# Patient Record
Sex: Male | Born: 2000 | Hispanic: Yes | Marital: Single | Smoking: Never smoker
Health system: Southern US, Community
[De-identification: ages and names within clinical notes are randomized; demographics above are authoritative.]

## PROBLEM LIST (undated history)

## (undated) ENCOUNTER — Ambulatory Visit (HOSPITAL_COMMUNITY): Discharge status: Absent without leave | Payer: No Payment, Other

## (undated) DIAGNOSIS — S52509A Unspecified fracture of the lower end of unspecified radius, initial encounter for closed fracture: Secondary | ICD-10-CM

## (undated) DIAGNOSIS — S025XXA Fracture of tooth (traumatic), initial encounter for closed fracture: Secondary | ICD-10-CM

## (undated) DIAGNOSIS — U071 COVID-19: Secondary | ICD-10-CM

## (undated) DIAGNOSIS — N4400 Torsion of testis, unspecified: Secondary | ICD-10-CM

## (undated) DIAGNOSIS — F909 Attention-deficit hyperactivity disorder, unspecified type: Secondary | ICD-10-CM

## (undated) DIAGNOSIS — S060XAA Concussion with loss of consciousness status unknown, initial encounter: Secondary | ICD-10-CM

## (undated) HISTORY — PX: SURGERY SCROTAL / TESTICULAR: SUR1316

## (undated) HISTORY — DX: Unspecified fracture of the lower end of unspecified radius, initial encounter for closed fracture: S52.509A

## (undated) HISTORY — DX: Attention-deficit hyperactivity disorder, unspecified type: F90.9

## (undated) HISTORY — DX: Fracture of tooth (traumatic), initial encounter for closed fracture: S02.5XXA

---
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
## 2019-07-24 ENCOUNTER — Emergency Department (HOSPITAL_COMMUNITY)
Admission: EM | Admit: 2019-07-24 | Discharge: 2019-07-24 | Disposition: A | Discharge status: Home / Self Care | Payer: BC Managed Care – PPO | Attending: Emergency Medicine | Admitting: Emergency Medicine

## 2019-07-24 ENCOUNTER — Encounter (HOSPITAL_COMMUNITY): Payer: Self-pay | Admitting: Emergency Medicine

## 2019-07-24 ENCOUNTER — Other Ambulatory Visit: Payer: Self-pay

## 2019-07-24 ENCOUNTER — Emergency Department (HOSPITAL_COMMUNITY): Payer: BC Managed Care – PPO

## 2019-07-24 DIAGNOSIS — Y9389 Activity, other specified: Secondary | ICD-10-CM | POA: Insufficient documentation

## 2019-07-24 DIAGNOSIS — M545 Low back pain, unspecified: Secondary | ICD-10-CM

## 2019-07-24 DIAGNOSIS — Y998 Other external cause status: Secondary | ICD-10-CM | POA: Insufficient documentation

## 2019-07-24 DIAGNOSIS — Y92410 Unspecified street and highway as the place of occurrence of the external cause: Secondary | ICD-10-CM | POA: Diagnosis not present

## 2019-07-24 MED ORDER — IBUPROFEN 600 MG PO TABS: 600.0000 mg | ORAL_TABLET | Freq: Four times a day (QID) | ORAL | 0 refills | Status: DC | PRN

## 2019-07-24 MED ORDER — METHOCARBAMOL 500 MG PO TABS
500.0000 mg | ORAL_TABLET | Freq: Once | ORAL | Status: AC
  Administered 2019-07-24: 21:00:00 500 mg via ORAL
  Filled 2019-07-24: qty 1
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
## 2019-07-24 NOTE — Discharge Instructions (Addendum)
Take Motrin as needed for pain.  Ice affected area.  Follow-up with your primary care provider in 2 days for continued evaluation.  Return to the emergency room immediately for new or worsening symptoms or concerns, such as new or worsening pain, difficulty peeing, numbness of the groin, difficulty walking or any concerns at all.

## 2019-07-28 ENCOUNTER — Other Ambulatory Visit: Payer: Self-pay

## 2019-07-28 ENCOUNTER — Encounter (HOSPITAL_COMMUNITY): Payer: Self-pay | Admitting: Emergency Medicine

## 2019-07-28 ENCOUNTER — Emergency Department (HOSPITAL_COMMUNITY)
Admission: EM | Admit: 2019-07-28 | Discharge: 2019-07-28 | Disposition: A | Discharge status: Home / Self Care | Payer: BC Managed Care – PPO | Attending: Emergency Medicine | Admitting: Emergency Medicine

## 2019-07-28 DIAGNOSIS — S060X0A Concussion without loss of consciousness, initial encounter: Secondary | ICD-10-CM | POA: Insufficient documentation

## 2019-07-28 DIAGNOSIS — F909 Attention-deficit hyperactivity disorder, unspecified type: Secondary | ICD-10-CM | POA: Diagnosis not present

## 2019-07-28 DIAGNOSIS — S0990XA Unspecified injury of head, initial encounter: Secondary | ICD-10-CM | POA: Diagnosis present

## 2019-07-28 DIAGNOSIS — Y93I9 Activity, other involving external motion: Secondary | ICD-10-CM | POA: Diagnosis not present

## 2019-07-28 DIAGNOSIS — Z79899 Other long term (current) drug therapy: Secondary | ICD-10-CM | POA: Insufficient documentation

## 2019-07-28 DIAGNOSIS — Y999 Unspecified external cause status: Secondary | ICD-10-CM | POA: Diagnosis not present

## 2019-07-28 DIAGNOSIS — Y92410 Unspecified street and highway as the place of occurrence of the external cause: Secondary | ICD-10-CM | POA: Insufficient documentation

## 2019-07-28 DIAGNOSIS — S060X0D Concussion without loss of consciousness, subsequent encounter: Secondary | ICD-10-CM

## 2019-07-28 MED ORDER — METHOCARBAMOL 500 MG PO TABS: 500.0000 mg | ORAL_TABLET | Freq: Two times a day (BID) | ORAL | 0 refills | Status: DC

## 2019-07-28 MED ORDER — LIDOCAINE 5 % EX PTCH: 1.0000 | MEDICATED_PATCH | CUTANEOUS | 0 refills | Status: DC

## 2019-07-28 MED ORDER — ONDANSETRON 4 MG PO TBDP: 4.0000 mg | ORAL_TABLET | Freq: Three times a day (TID) | ORAL | 0 refills | Status: DC | PRN

## 2019-07-28 NOTE — ED Notes (Signed)
Patient given discharge instructions patient verbalizes understanding. 

## 2019-07-28 NOTE — Discharge Instructions (Signed)
Head Injury You have been seen today for a head injury. It does not appear to be serious at this time.  Close observation: The close observation period is usually 6 hours from the injury. This includes staying awake and having a trustworthy adult monitor you to assure your condition does not worsen. You should be in regular contact with this person and ideally, they should be able to monitor you in person.  Secondary observation: The secondary observation period is usually 24 hours from the injury. You are allowed to sleep during this time. A trustworthy adult should intermittently monitor you to assure your condition does not worsen.   Overall head injury/concussion care: Rest: Be sure to get plenty of rest. You will need more rest and sleep while you recover. Hydration: Be sure to stay well hydrated by having a goal of drinking about 0.5 liters of water an hour. Pain:  Antiinflammatory medications: Take 600 mg of ibuprofen every 6 hours or 440 mg (over the counter dose) to 500 mg (prescription dose) of naproxen every 12 hours or for the next 3 days. After this time, these medications may be used as needed for pain. Take these medications with food to avoid upset stomach. Choose only one of these medications, do not take them together. Tylenol: Should you continue to have additional pain while taking the ibuprofen or naproxen, you may add in tylenol as needed. Your daily total maximum amount of tylenol from all sources should be limited to 4000mg /day for persons without liver problems, or 2000mg /day for those with liver problems. Nausea/vomiting: Use the ondansetron (generic for Zofran) for nausea or vomiting.  This medication may not prevent all vomiting or nausea, but can help facilitate better hydration. Things that can help with nausea/vomiting also include peppermint/menthol candies, vitamin B12, and ginger. Return to sports and activities: In general, you may return to normal activities once  symptoms have subsided, however, you would ideally be cleared by a primary care provider or other qualified medical professional prior to return to these activities.  Follow up: Follow up with the concussion clinic or your primary care provider for further management of this issue. Return: Return to the ED should you begin to have confusion, abnormal behavior, aggression, violence, or personality changes, repeated vomiting, vision loss, numbness or weakness on one side of the body, difficulty standing due to dizziness, significantly worsening pain, or any other major concerns.    Take it easy, but do not lay around too much as this may make any stiffness worse.  Antiinflammatory medications: Take 600 mg of ibuprofen every 6 hours or 440 mg (over the counter dose) to 500 mg (prescription dose) of naproxen every 12 hours for the next 3 days. After this time, these medications may be used as needed for pain. Take these medications with food to avoid upset stomach. Choose only one of these medications, do not take them together. Acetaminophen (generic for Tylenol): Should you continue to have additional pain while taking the ibuprofen or naproxen, you may add in acetaminophen as needed. Your daily total maximum amount of acetaminophen from all sources should be limited to 4000mg /day for persons without liver problems, or 2000mg /day for those with liver problems. Methocarbamol: Methocarbamol (generic for Robaxin) is a muscle relaxer and can help relieve stiff muscles or muscle spasms.  Do not drive or perform other dangerous activities while taking this medication as it can cause drowsiness as well as changes in reaction time and judgement. Lidocaine patches: These are available via  either prescription or over-the-counter. The over-the-counter option may be more economical one and are likely just as effective. There are multiple over-the-counter brands, such as Salonpas. Ice: May apply ice to the area over  the next 24 hours for 15 minutes at a time to reduce pain, inflammation, and swelling, if present. Exercises: Be sure to perform the attached exercises starting with three times a week and working up to performing them daily. This is an essential part of preventing long term problems.  Follow up: Follow up with a primary care provider for any future management of these complaints. Be sure to follow up within 7-10 days. Return: Return to the ED should symptoms worsen.  For prescription assistance, may try using prescription discount sites or apps, such as goodrx.com
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
## 2019-09-16 ENCOUNTER — Ambulatory Visit (HOSPITAL_COMMUNITY)
Admission: EM | Admit: 2019-09-16 | Discharge: 2019-09-16 | Disposition: A | Discharge status: Home / Self Care | Payer: BC Managed Care – PPO | Attending: Internal Medicine | Admitting: Internal Medicine

## 2019-09-16 ENCOUNTER — Other Ambulatory Visit: Payer: Self-pay

## 2019-09-16 ENCOUNTER — Encounter (HOSPITAL_COMMUNITY): Payer: Self-pay

## 2019-09-16 DIAGNOSIS — B349 Viral infection, unspecified: Secondary | ICD-10-CM | POA: Diagnosis not present

## 2019-09-16 DIAGNOSIS — R070 Pain in throat: Secondary | ICD-10-CM | POA: Diagnosis not present

## 2019-09-16 DIAGNOSIS — Z20822 Contact with and (suspected) exposure to covid-19: Secondary | ICD-10-CM | POA: Diagnosis not present

## 2019-09-16 DIAGNOSIS — J029 Acute pharyngitis, unspecified: Secondary | ICD-10-CM | POA: Insufficient documentation

## 2019-09-16 DIAGNOSIS — R197 Diarrhea, unspecified: Secondary | ICD-10-CM | POA: Insufficient documentation

## 2019-09-16 DIAGNOSIS — Z79899 Other long term (current) drug therapy: Secondary | ICD-10-CM | POA: Diagnosis not present

## 2019-09-16 LAB — POCT RAPID STREP A: Streptococcus, Group A Screen (Direct): NEGATIVE

## 2019-09-16 NOTE — Discharge Instructions (Addendum)
This is most likely viral. Your rapid strep test was negative. You can continue over-the-counter medication as needed. We will call you with any positive Covid results

## 2019-09-16 NOTE — ED Triage Notes (Signed)
Patient reports diarrhea, cough, and sore throat x1 wk

## 2019-09-16 NOTE — ED Provider Notes (Signed)
[facility]    CSN: [id number] Arrival date & time: 09/16/19  1211      History   Chief Complaint Chief Complaint  Patient presents with  . Sore Throat  . Cough  . Diarrhea    HPI Steven Rhodes is a 19 y.o. male.   Patient is an otherwise healthy 19 year old male who presents today for approximate 1 week of cough, sore throat, nasal congestion and rhinorrhea. Symptoms been constant. He has been taking Zyrtec-D with some relief. Denies any associated fever, chills, body aches, loss of taste or smell. He reports a few episodes of diarrhea associated. Works at a high school as a Copy.  ROS per HPI      Past Medical History:  Diagnosis Date  . ADHD (attention deficit hyperactivity disorder)   . Fracture, radius, distal 03/16/11  . FRACTURED TOOTH 08/29/2009   Qualifier: Diagnosis of  By: Sheffield Slider MD, Deniece Portela    . Premature birth     Patient Active Problem List   Diagnosis Date Noted  . Gastroenteritis 11/27/2017  . Self-injurious behavior 05/10/2017  . 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 Depression   . Depression with anxiety 03/04/2017  . H/O sexually transmitted disease 08/23/2016  . Abdominal pain 06/25/2014  . Oppositional defiant behavior 06/18/2011  . Tinea corporis 10/02/2010  . PREMATURE BIRTH 08/29/2009  . VISUAL ACUITY, DECREASED 09/30/2007  . Attention deficit hyperactivity disorder (ADHD) 07/25/2006    Past Surgical History:  Procedure Laterality Date  . ORCHIOPEXY  05/09/2012   Procedure: ORCHIOPEXY PEDIATRIC;  Surgeon: Valetta Fuller, MD;  Location: [facility] OR;  Service: Urology;  Laterality: Bilateral;  . TESTICLE TORSION REDUCTION  05/09/2012   Procedure: TESTICULAR TORSION REPAIR;  Surgeon: Valetta Fuller, MD;  Location: [facility] OR;  Service: Urology;  Laterality: N/A;       Home Medications    Prior to Admission medications   Medication Sig Start Date End Date Taking? Authorizing Provider   benzonatate (TESSALON) 100 MG capsule Take 1 capsule (100 mg total) by mouth 2 (two) times daily as needed for cough. 10/06/17   Wurst, [place], PA-C  cetirizine-pseudoephedrine (ZYRTEC-D) 5-120 MG tablet Take 1 tablet by mouth daily. 10/06/17   Wurst, [place], PA-C  ibuprofen (ADVIL) 600 MG tablet Take 1 tablet (600 mg total) by mouth every 6 (six) hours as needed. 07/24/19   Kendrick, Caitlyn S, PA-C  lidocaine (LIDODERM) 5 % Place 1 patch onto the skin daily. Remove & Discard patch within 12 hours or as directed by MD 07/28/19   Joy, Shawn C, PA-C  lidocaine (XYLOCAINE) 2 % solution Use as directed 15 mLs in the mouth or throat as needed for mouth pain. 10/06/17   Wurst, [place], PA-C  methocarbamol (ROBAXIN) 500 MG tablet Take 1 tablet (500 mg total) by mouth 2 (two) times daily. 07/28/19   Joy, Shawn C, PA-C  ondansetron (ZOFRAN ODT) 4 MG disintegrating tablet Take 1 tablet (4 mg total) by mouth every 8 (eight) hours as needed for nausea or vomiting. 07/28/19   Joy, Hillard Danker, PA-C    Family History Family History  Problem Relation Age of Onset  . Healthy Mother   . Diabetes Father   . Hypertension Father     Social History Social History   Tobacco Use  . Smoking status: Never Smoker  . Smokeless tobacco: Never Used  Substance Use Topics  . Alcohol use: Never  . Drug use: Never  Allergies   Patient has no known allergies.   Review of Systems Review of Systems   Physical Exam Triage Vital Signs ED Triage Vitals  Enc Vitals Group     BP 09/16/19 1222 134/65     Pulse Rate 09/16/19 1222 63     Resp 09/16/19 1222 18     Temp 09/16/19 1222 98.3 F (36.8 C)     Temp Source 09/16/19 1222 Oral     SpO2 09/16/19 1222 99 %     Weight --      Height --      Head Circumference --      Peak Flow --      Pain Score 09/16/19 1221 6     Pain Loc --      Pain Edu? --      Excl. in [place]? --    No data found.  Updated Vital Signs BP 134/65 (BP Location: Right Arm)   Pulse 63    Temp 98.3 F (36.8 C) (Oral)   Resp 18   SpO2 99%   Visual Acuity Right Eye Distance:   Left Eye Distance:   Bilateral Distance:    Right Eye Near:   Left Eye Near:    Bilateral Near:     Physical Exam Vitals and nursing note reviewed.  Constitutional:      General: He is not in acute distress.    Appearance: Normal appearance. He is not ill-appearing, toxic-appearing or diaphoretic.  HENT:     Head: Normocephalic and atraumatic.     Right Ear: Tympanic membrane and ear canal normal.     Left Ear: Tympanic membrane and ear canal normal.     Nose: Nose normal.     Mouth/Throat:     Pharynx: Posterior oropharyngeal erythema present.     Comments: 2+ tonsillar swelling bilateral. No exudate Eyes:     Conjunctiva/sclera: Conjunctivae normal.  Cardiovascular:     Rate and Rhythm: Normal rate and regular rhythm.  Pulmonary:     Effort: Pulmonary effort is normal.     Breath sounds: Normal breath sounds.  Musculoskeletal:        General: Normal range of motion.     Cervical back: Normal range of motion.  Skin:    General: Skin is warm and dry.  Neurological:     Mental Status: He is alert.  Psychiatric:        Mood and Affect: Mood normal.      UC Treatments / Results  Labs (all labs ordered are listed, but only abnormal results are displayed) Labs Reviewed  SARS CORONAVIRUS 2 (TAT 6-24 HRS)  CULTURE, GROUP A STREP [facility])  POCT RAPID STREP A    EKG   Radiology No results found.  Procedures Procedures (including critical care time)  Medications Ordered in UC Medications - No data to display  Initial Impression / Assessment and Plan / UC Course  I have reviewed the triage vital signs and the nursing notes.  Pertinent labs & imaging results that were available during my care of the patient were reviewed by me and considered in my medical decision making (see chart for details).     Viral illness Rapid strep test negative Over-the-counter  medications as needed. Continue the allergy medication Covid swab pending we will call with any positive results Final Clinical Impressions(s) / UC Diagnoses   Final diagnoses:  Viral illness     Discharge Instructions     This is most likely  viral. Your rapid strep test was negative. You can continue over-the-counter medication as needed. We will call you with any positive Covid results    ED Prescriptions    None     PDMP not reviewed this encounter.   Janace Aris, NP 09/16/19 1307

## 2019-09-17 LAB — SARS CORONAVIRUS 2 (TAT 6-24 HRS): SARS Coronavirus 2: NEGATIVE

## 2019-09-18 LAB — CULTURE, GROUP A STREP (THRC)

## 2019-10-17 ENCOUNTER — Other Ambulatory Visit: Payer: Self-pay

## 2019-10-17 ENCOUNTER — Ambulatory Visit (HOSPITAL_COMMUNITY)
Admission: EM | Admit: 2019-10-17 | Discharge: 2019-10-17 | Disposition: A | Discharge status: Home / Self Care | Payer: BC Managed Care – PPO | Attending: Urgent Care | Admitting: Urgent Care

## 2019-10-17 ENCOUNTER — Encounter (HOSPITAL_COMMUNITY): Payer: Self-pay

## 2019-10-17 DIAGNOSIS — R0981 Nasal congestion: Secondary | ICD-10-CM | POA: Insufficient documentation

## 2019-10-17 DIAGNOSIS — R0789 Other chest pain: Secondary | ICD-10-CM

## 2019-10-17 DIAGNOSIS — R05 Cough: Secondary | ICD-10-CM | POA: Insufficient documentation

## 2019-10-17 DIAGNOSIS — R519 Headache, unspecified: Secondary | ICD-10-CM | POA: Insufficient documentation

## 2019-10-17 DIAGNOSIS — J029 Acute pharyngitis, unspecified: Secondary | ICD-10-CM | POA: Insufficient documentation

## 2019-10-17 DIAGNOSIS — R059 Cough, unspecified: Secondary | ICD-10-CM

## 2019-10-17 DIAGNOSIS — R52 Pain, unspecified: Secondary | ICD-10-CM

## 2019-10-17 DIAGNOSIS — Z20822 Contact with and (suspected) exposure to covid-19: Secondary | ICD-10-CM | POA: Insufficient documentation

## 2019-10-17 DIAGNOSIS — J069 Acute upper respiratory infection, unspecified: Secondary | ICD-10-CM | POA: Insufficient documentation

## 2019-10-17 LAB — SARS CORONAVIRUS 2 (TAT 6-24 HRS): SARS Coronavirus 2: NEGATIVE

## 2019-10-17 LAB — POCT RAPID STREP A: Streptococcus, Group A Screen (Direct): NEGATIVE

## 2019-10-17 MED ORDER — ACETAMINOPHEN 325 MG PO TABS
ORAL_TABLET | ORAL | Status: AC
  Filled 2019-10-17: qty 2

## 2019-10-17 MED ORDER — ACETAMINOPHEN 325 MG PO TABS
650.0000 mg | ORAL_TABLET | Freq: Once | ORAL | Status: AC
  Administered 2019-10-17: 650 mg via ORAL

## 2019-10-17 MED ORDER — PSEUDOEPHEDRINE HCL 60 MG PO TABS: 60.0000 mg | ORAL_TABLET | Freq: Three times a day (TID) | ORAL | 0 refills | Status: DC | PRN

## 2019-10-17 MED ORDER — CETIRIZINE HCL 10 MG PO TABS: 10.0000 mg | ORAL_TABLET | Freq: Every day | ORAL | 0 refills | Status: DC

## 2019-10-17 MED ORDER — BENZONATATE 100 MG PO CAPS: 100.0000 mg | ORAL_CAPSULE | Freq: Three times a day (TID) | ORAL | 0 refills | Status: DC | PRN

## 2019-10-17 MED ORDER — PROMETHAZINE-DM 6.25-15 MG/5ML PO SYRP: 5.0000 mL | ORAL_SOLUTION | Freq: Every evening | ORAL | 0 refills | Status: DC | PRN

## 2019-10-17 NOTE — Discharge Instructions (Addendum)

## 2019-10-17 NOTE — ED Triage Notes (Signed)
Pt reports nasal congestion, sore throat, body aches and headaches x 3 days. NyQuil did not help with the symptoms.

## 2019-10-17 NOTE — ED Provider Notes (Signed)
Steven Rhodes   MRN: [id number] DOB: July 10, 2000  Subjective:   Steven Rhodes is a 19 y.o. male presenting for 3 day hx of acute onset malaise. ROS as below. Taking APAP, Vick's. Denies hx of asthma. Does vaping. Had COVID 19 screening as a part of his job 1 week ago but never got his results. Has not had COVID 19 vaccine. Has not had COVID 19 yet.   Denies taking chronic medications.    No Known Allergies  Past Medical History:  Diagnosis Date  . ADHD (attention deficit hyperactivity disorder)   . Fracture, radius, distal 03/16/11  . FRACTURED TOOTH 08/29/2009   Qualifier: Diagnosis of  By: Walker Kehr MD, Patrick Jupiter    . Premature birth      Past Surgical History:  Procedure Laterality Date  . ORCHIOPEXY  05/09/2012   Procedure: ORCHIOPEXY PEDIATRIC;  Surgeon: Bernestine Amass, MD;  Location: [place];  Service: Urology;  Laterality: Bilateral;  . TESTICLE TORSION REDUCTION  05/09/2012   Procedure: TESTICULAR TORSION REPAIR;  Surgeon: Bernestine Amass, MD;  Location: [place];  Service: Urology;  Laterality: N/A;    Family History  Problem Relation Age of Onset  . Healthy Mother   . Diabetes Father   . Hypertension Father     Social History   Tobacco Use  . Smoking status: Never Smoker  . Smokeless tobacco: Never Used  Substance Use Topics  . Alcohol use: Never  . Drug use: Never    Review of Systems  Constitutional: Positive for fever and malaise/fatigue.  HENT: Positive for congestion and sore throat. Negative for ear pain and sinus pain.   Eyes: Negative for discharge and redness.  Respiratory: Positive for cough. Negative for hemoptysis, shortness of breath and wheezing.   Cardiovascular: Positive for chest pain.  Gastrointestinal: Negative for abdominal pain, diarrhea, nausea and vomiting.  Genitourinary: Negative for dysuria, flank pain and hematuria.  Musculoskeletal: Positive for myalgias.  Skin: Negative for rash.  Neurological: Negative for dizziness,  weakness and headaches.  Psychiatric/Behavioral: Negative for depression and substance abuse.     Objective:   Vitals: BP 110/88 (BP Location: Right Arm)   Pulse (!) 104   Temp (!) 100.7 F (38.2 C) (Oral)   Resp (!) 24   SpO2 100%   Pulse was 96 on recheck. Temp 99.63F.   Physical Exam Constitutional:      General: He is not in acute distress.    Appearance: Normal appearance. He is well-developed and normal weight. He is not ill-appearing, toxic-appearing or diaphoretic.  HENT:     Head: Normocephalic and atraumatic.     Right Ear: Tympanic membrane, ear canal and external ear normal. There is no impacted cerumen.     Left Ear: Tympanic membrane, ear canal and external ear normal. There is no impacted cerumen.     Nose: Nose normal. No congestion or rhinorrhea.     Mouth/Throat:     Mouth: Mucous membranes are moist.     Pharynx: Oropharynx is clear. No oropharyngeal exudate or posterior oropharyngeal erythema.  Eyes:     General: No scleral icterus.       Right eye: No discharge.        Left eye: No discharge.     Extraocular Movements: Extraocular movements intact.     Conjunctiva/sclera: Conjunctivae normal.     Pupils: Pupils are equal, round, and reactive to light.  Cardiovascular:     Rate and Rhythm: Normal rate and regular rhythm.  Heart sounds: Normal heart sounds. No murmur. No friction rub. No gallop.   Pulmonary:     Effort: Pulmonary effort is normal. No respiratory distress.     Breath sounds: Normal breath sounds. No stridor. No wheezing, rhonchi or rales.  Musculoskeletal:     Cervical back: Normal range of motion and neck supple. No rigidity. No muscular tenderness.  Skin:    General: Skin is warm and dry.  Neurological:     General: No focal deficit present.     Mental Status: He is alert and oriented to person, place, and time.  Psychiatric:        Mood and Affect: Mood normal.        Behavior: Behavior normal.        Thought Content: Thought  content normal.     Results for orders placed or performed during the hospital encounter of 10/17/19 (from the past 24 hour(s))  POCT rapid strep A [facility])     Status: None   Collection Time: 10/17/19  3:33 PM  Result Value Ref Range   Streptococcus, Group A Screen (Direct) NEGATIVE NEGATIVE    Assessment and Plan :   PDMP not reviewed this encounter.  1. Nasal congestion   2. Sore throat   3. Cough   4. Body aches   5. Generalized headaches   6. Atypical chest pain     Will manage for viral illness such as viral URI, viral syndrome, viral rhinitis, COVID-19. Counseled patient on nature of COVID-19 including modes of transmission, diagnostic testing, management and supportive care.  Offered symptomatic relief. COVID 19 testing is pending. Counseled patient on potential for adverse effects with medications prescribed/recommended today, ER and return-to-clinic precautions discussed, patient verbalized understanding.     Steven Rhodes, [place] 10/17/19 1603

## 2019-10-20 LAB — CULTURE, GROUP A STREP (THRC)

## 2020-02-12 ENCOUNTER — Emergency Department (HOSPITAL_COMMUNITY)
Admission: EM | Admit: 2020-02-12 | Discharge: 2020-02-13 | Disposition: A | Discharge status: Home / Self Care | Payer: HRSA Program | Attending: Emergency Medicine | Admitting: Emergency Medicine

## 2020-02-12 ENCOUNTER — Emergency Department (HOSPITAL_COMMUNITY): Payer: HRSA Program

## 2020-02-12 ENCOUNTER — Other Ambulatory Visit: Payer: Self-pay

## 2020-02-12 DIAGNOSIS — U071 COVID-19: Secondary | ICD-10-CM

## 2020-02-12 DIAGNOSIS — Z79899 Other long term (current) drug therapy: Secondary | ICD-10-CM | POA: Insufficient documentation

## 2020-02-12 DIAGNOSIS — R109 Unspecified abdominal pain: Secondary | ICD-10-CM | POA: Diagnosis not present

## 2020-02-12 DIAGNOSIS — R509 Fever, unspecified: Secondary | ICD-10-CM | POA: Diagnosis present

## 2020-02-12 LAB — COMPREHENSIVE METABOLIC PANEL
ALT: 43 U/L (ref 0–44)
AST: 32 U/L (ref 15–41)
Albumin: 4.5 g/dL (ref 3.5–5.0)
Alkaline Phosphatase: 65 U/L (ref 38–126)
Anion gap: 13 (ref 5–15)
BUN: 16 mg/dL (ref 6–20)
CO2: 26 mmol/L (ref 22–32)
Calcium: 9.9 mg/dL (ref 8.9–10.3)
Chloride: 99 mmol/L (ref 98–111)
Creatinine, Ser: 1.16 mg/dL (ref 0.61–1.24)
GFR calc Af Amer: >60 mL/min (ref >60)
GFR calc non Af Amer: >60 mL/min (ref >60)
Glucose, Bld: 100 mg/dL — ABNORMAL HIGH (ref 70–99)
Potassium: 4.3 mmol/L (ref 3.5–5.1)
Sodium: 138 mmol/L (ref 135–145)
Total Bilirubin: 0.9 mg/dL (ref 0.3–1.2)
Total Protein: 8.4 g/dL — ABNORMAL HIGH (ref 6.5–8.1)

## 2020-02-12 LAB — CBC WITH DIFFERENTIAL/PLATELET
Abs Immature Granulocytes: 0.05 10*3/uL (ref 0.00–0.07)
Basophils Absolute: 0 10*3/uL (ref 0.0–0.1)
Basophils Relative: 0 %
Eosinophils Absolute: 0 10*3/uL (ref 0.0–0.5)
Eosinophils Relative: 0 %
HCT: 47.1 % (ref 39.0–52.0)
Hemoglobin: 15.1 g/dL (ref 13.0–17.0)
Immature Granulocytes: 1 %
Lymphocytes Relative: 11 %
Lymphs Abs: 0.8 10*3/uL (ref 0.7–4.0)
MCH: 28.1 pg (ref 26.0–34.0)
MCHC: 32.1 g/dL (ref 30.0–36.0)
MCV: 87.5 fL (ref 80.0–100.0)
Monocytes Absolute: 0.9 10*3/uL (ref 0.1–1.0)
Monocytes Relative: 11 %
Neutro Abs: 5.9 10*3/uL (ref 1.7–7.7)
Neutrophils Relative %: 77 %
Platelets: 248 10*3/uL (ref 150–400)
RBC: 5.38 MIL/uL (ref 4.22–5.81)
RDW: 13.2 % (ref 11.5–15.5)
WBC: 7.6 10*3/uL (ref 4.0–10.5)
nRBC: 0 % (ref 0.0–0.2)

## 2020-02-12 LAB — SARS CORONAVIRUS 2 BY RT PCR (HOSPITAL ORDER, PERFORMED IN ~~LOC~~ HOSPITAL LAB): SARS Coronavirus 2: POSITIVE — AB

## 2020-02-12 MED ORDER — ACETAMINOPHEN 500 MG PO TABS
1000.0000 mg | ORAL_TABLET | Freq: Once | ORAL | Status: AC
  Administered 2020-02-12: 1000 mg via ORAL
  Filled 2020-02-12: qty 2

## 2020-02-12 NOTE — ED Triage Notes (Signed)
C/O left flank pain, fever and cough. Reported family member + for Covid. Patient also mentioned would like an STD screening.

## 2020-02-13 ENCOUNTER — Emergency Department (HOSPITAL_COMMUNITY): Payer: HRSA Program

## 2020-02-13 LAB — URINALYSIS, ROUTINE W REFLEX MICROSCOPIC
Bacteria, UA: NONE SEEN
Bilirubin Urine: NEGATIVE
Glucose, UA: NEGATIVE mg/dL
Ketones, ur: 5 mg/dL — AB
Leukocytes,Ua: NEGATIVE
Nitrite: NEGATIVE
Protein, ur: 30 mg/dL — AB
Specific Gravity, Urine: 1.032 — ABNORMAL HIGH (ref 1.005–1.030)
pH: 5 (ref 5.0–8.0)

## 2020-02-13 MED ORDER — FAMOTIDINE IN NACL 20-0.9 MG/50ML-% IV SOLN: 20.0000 mg | Freq: Once | INTRAVENOUS | Status: DC | PRN

## 2020-02-13 MED ORDER — SODIUM CHLORIDE 0.9 % IV SOLN: INTRAVENOUS | Status: DC | PRN

## 2020-02-13 MED ORDER — EPINEPHRINE 0.3 MG/0.3ML IJ SOAJ: 0.3000 mg | Freq: Once | INTRAMUSCULAR | Status: DC | PRN

## 2020-02-13 MED ORDER — KETOROLAC TROMETHAMINE 60 MG/2ML IM SOLN
30.0000 mg | Freq: Once | INTRAMUSCULAR | Status: AC
  Administered 2020-02-13: 30 mg via INTRAMUSCULAR
  Filled 2020-02-13: qty 2

## 2020-02-13 MED ORDER — KETOROLAC TROMETHAMINE 15 MG/ML IJ SOLN
15.0000 mg | Freq: Once | INTRAMUSCULAR | Status: AC
  Administered 2020-02-13: 15 mg via INTRAVENOUS
  Filled 2020-02-13: qty 1

## 2020-02-13 MED ORDER — ALBUTEROL SULFATE HFA 108 (90 BASE) MCG/ACT IN AERS
2.0000 | INHALATION_SPRAY | Freq: Once | RESPIRATORY_TRACT | Status: AC | PRN
  Administered 2020-02-13: 2 via RESPIRATORY_TRACT
  Filled 2020-02-13: qty 6.7

## 2020-02-13 MED ORDER — DIPHENHYDRAMINE HCL 50 MG/ML IJ SOLN: 50.0000 mg | Freq: Once | INTRAMUSCULAR | Status: DC | PRN

## 2020-02-13 MED ORDER — ACETAMINOPHEN 500 MG PO TABS
1000.0000 mg | ORAL_TABLET | Freq: Once | ORAL | Status: DC
  Filled 2020-02-13: qty 2

## 2020-02-13 MED ORDER — SODIUM CHLORIDE 0.9 % IV SOLN
1200.0000 mg | Freq: Once | INTRAVENOUS | Status: AC
  Administered 2020-02-13: 1200 mg via INTRAVENOUS
  Filled 2020-02-13: qty 10

## 2020-02-13 MED ORDER — METHYLPREDNISOLONE SODIUM SUCC 125 MG IJ SOLR
125.0000 mg | Freq: Once | INTRAMUSCULAR | Status: AC | PRN
  Administered 2020-02-13: 125 mg via INTRAVENOUS
  Filled 2020-02-13: qty 2

## 2020-02-13 NOTE — ED Notes (Signed)
Pt returned from CT °

## 2020-02-13 NOTE — ED Notes (Signed)
Pt ambulated in room with continuous pulse ox. Pt remained at 100% on RA with no cough or SOB. Resp even and unlabored. MD at bedside to reassess patient.

## 2020-02-13 NOTE — ED Provider Notes (Signed)
[facility] EMERGENCY DEPARTMENT Provider Note   CSN: [id number] Arrival date & time: 02/12/20  1749     History Chief Complaint  Patient presents with  . Fever  . Flank Pain  . Exposure to STD    Steven Rhodes is a 19 y.o. male.  Father diagnosed with Covid recently.  Patient with fever cough since yesterday.  Also some left-sided flank pain.  No other associated symptoms.  No nausea, vomiting.  Has a little weakness.  Some chills no measured temperatures at home.  Has not tried any for the symptoms.   Fever Temp source:  Subjective Severity:  Mild Timing:  Constant Progression:  Worsening Chronicity:  New Relieved by:  Nothing Worsened by:  Nothing Flank Pain       Past Medical History:  Diagnosis Date  . ADHD (attention deficit hyperactivity disorder)   . Fracture, radius, distal 03/16/11  . FRACTURED TOOTH 08/29/2009   Qualifier: Diagnosis of  By: Sheffield Slider MD, Deniece Portela    . Premature birth     Patient Active Problem List   Diagnosis Date Noted  . Gastroenteritis 11/27/2017  . Self-injurious behavior 05/10/2017  . 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 Depression   . Depression with anxiety 03/04/2017  . H/O sexually transmitted disease 08/23/2016  . Abdominal pain 06/25/2014  . Oppositional defiant behavior 06/18/2011  . Tinea corporis 10/02/2010  . PREMATURE BIRTH 08/29/2009  . VISUAL ACUITY, DECREASED 09/30/2007  . Attention deficit hyperactivity disorder (ADHD) 07/25/2006    Past Surgical History:  Procedure Laterality Date  . ORCHIOPEXY  05/09/2012   Procedure: ORCHIOPEXY PEDIATRIC;  Surgeon: Valetta Fuller, MD;  Location: [facility] OR;  Service: Urology;  Laterality: Bilateral;  . TESTICLE TORSION REDUCTION  05/09/2012   Procedure: TESTICULAR TORSION REPAIR;  Surgeon: Valetta Fuller, MD;  Location: [facility] OR;  Service: Urology;  Laterality: N/A;       Family History  Problem Relation Age of  Onset  . Healthy Mother   . Diabetes Father   . Hypertension Father     Social History   Tobacco Use  . Smoking status: Never Smoker  . Smokeless tobacco: Never Used  Vaping Use  . Vaping Use: Some days  Substance Use Topics  . Alcohol use: Never  . Drug use: Never    Home Medications Prior to Admission medications   Medication Sig Start Date End Date Taking? Authorizing Provider  benzonatate (TESSALON) 100 MG capsule Take 1-2 capsules (100-200 mg total) by mouth 3 (three) times daily as needed for cough. 10/17/19   Wallis Bamberg, PA-C  cetirizine (ZYRTEC ALLERGY) 10 MG tablet Take 1 tablet (10 mg total) by mouth daily. 10/17/19   Wallis Bamberg, PA-C  promethazine-dextromethorphan (PROMETHAZINE-DM) 6.25-15 MG/5ML syrup Take 5 mLs by mouth at bedtime as needed for cough. 10/17/19   Wallis Bamberg, PA-C  pseudoephedrine (SUDAFED) 60 MG tablet Take 1 tablet (60 mg total) by mouth every 8 (eight) hours as needed for congestion. 10/17/19   Wallis Bamberg, PA-C    Allergies    Patient has no known allergies.  Review of Systems   Review of Systems  Constitutional: Positive for fever.  Genitourinary: Positive for flank pain.  All other systems reviewed and are negative.   Physical Exam Updated Vital Signs BP 132/88   Pulse 73   Temp 97.9 F (36.6 C) (Oral)   Resp 20   SpO2 98%   Physical Exam Vitals and nursing note  reviewed.  Constitutional:      Appearance: He is well-developed.  HENT:     Head: Normocephalic and atraumatic.     Nose: No congestion or rhinorrhea.     Mouth/Throat:     Mouth: Mucous membranes are moist.  Eyes:     Pupils: Pupils are equal, round, and reactive to light.  Cardiovascular:     Rate and Rhythm: Normal rate.  Pulmonary:     Effort: Pulmonary effort is normal. No respiratory distress.  Abdominal:     General: There is no distension.  Musculoskeletal:        General: No swelling or tenderness. Normal range of motion.     Cervical back: Normal  range of motion.  Skin:    General: Skin is warm and dry.  Neurological:     General: No focal deficit present.     Mental Status: He is alert.     ED Results / Procedures / Treatments   Labs (all labs ordered are listed, but only abnormal results are displayed) Labs Reviewed  SARS CORONAVIRUS 2 BY RT PCR (HOSPITAL ORDER, PERFORMED IN [facility]) - Abnormal; Notable for the following components:      Result Value   SARS Coronavirus 2 POSITIVE (*)    All other components within normal limits  COMPREHENSIVE METABOLIC PANEL - Abnormal; Notable for the following components:   Glucose, Bld 100 (*)    Total Protein 8.4 (*)    All other components within normal limits  URINALYSIS, ROUTINE W REFLEX MICROSCOPIC - Abnormal; Notable for the following components:   Color, Urine AMBER (*)    APPearance HAZY (*)    Specific Gravity, Urine 1.032 (*)    Hgb urine dipstick SMALL (*)    Ketones, ur 5 (*)    Protein, ur 30 (*)    All other components within normal limits  CBC WITH DIFFERENTIAL/PLATELET  GC/CHLAMYDIA PROBE AMP ([place]) NOT AT [facility]    EKG None  Radiology DG Chest Portable 1 View  Result Date: 02/12/2020 CLINICAL DATA:  Shortness of breath EXAM: PORTABLE CHEST 1 VIEW COMPARISON:  None. FINDINGS: The heart size and mediastinal contours are within normal limits. No focal consolidation. No pulmonary edema. No pleural effusion. No pneumothorax. No acute osseous abnormality. IMPRESSION: No active disease. Electronically Signed   By: Tish Frederickson M.D.   On: 02/12/2020 21:40   CT Renal Stone Study  Result Date: 02/13/2020 CLINICAL DATA:  Flank pain.  Kidney stones suspected. EXAM: CT ABDOMEN AND PELVIS WITHOUT CONTRAST TECHNIQUE: Multidetector CT imaging of the abdomen and pelvis was performed following the standard protocol without IV contrast. COMPARISON:  None. FINDINGS: Lower chest: No acute abnormality. Hepatobiliary: No focal liver abnormality is seen. No  gallstones, gallbladder wall thickening, or biliary dilatation. Pancreas: Unremarkable. No pancreatic ductal dilatation or surrounding inflammatory changes. Spleen: Normal in size without focal abnormality. Adrenals/Urinary Tract: Normal appearance of the adrenal glands. Small stone within the right mid kidney measures 3 mm, image 76/6. No left renal calculi identified. No hydronephrosis identified bilaterally. No hydronephrosis, hydroureter or ureteral lithiasis identified. Urinary bladder appears unremarkable. Stomach/Bowel: Stomach is within normal limits. Several appendicoliths noted within the appendix. The appendix has a normal caliber and there is no periappendiceal inflammation. No evidence of bowel wall thickening, distention, or inflammatory changes. Vascular/Lymphatic: No significant vascular findings are present. No enlarged abdominal or pelvic lymph nodes. Reproductive: Prostate is unremarkable. Other: No free fluid or fluid collections. Musculoskeletal: No acute or  significant osseous findings. IMPRESSION: 1. No acute findings within the abdomen or pelvis. 2. Nonobstructing right renal calculus. Electronically Signed   By: Signa Kell M.D.   On: 02/13/2020 07:43    Procedures Procedures (including critical care time)  Medications Ordered in ED Medications  acetaminophen (TYLENOL) tablet 1,000 mg (1,000 mg Oral Given 02/12/20 1923)  ketorolac (TORADOL) injection 30 mg (30 mg Intramuscular Given 02/13/20 0413)  casirivimab-imdevimab (REGEN-COV) 1,200 mg in sodium chloride 0.9 % 110 mL IVPB (0 mg Intravenous Stopped 02/13/20 0549)  methylPREDNISolone sodium succinate (SOLU-MEDROL) 125 mg/2 mL injection 125 mg (125 mg Intravenous Given 02/13/20 0517)  albuterol (VENTOLIN HFA) 108 (90 Base) MCG/ACT inhaler 2 puff (2 puffs Inhalation Given 02/13/20 0808)  ketorolac (TORADOL) 15 MG/ML injection 15 mg (15 mg Intravenous Given 02/13/20 0516)    ED Course  I have reviewed the triage vital signs and  the nursing notes.  Pertinent labs & imaging results that were available during my care of the patient were reviewed by me and considered in my medical decision making (see chart for details).    MDM Rules/Calculators/A&P                          COVID without resp distress or GI symptoms suggesting need fo rhopsitalization. Is overweight so meets criteria for MAB infusion. Consented for same.   Stone study negative. Labs unremarkable. Patient stable after MAB. Stable for discharge.   Final Clinical Impression(s) / ED Diagnoses Final diagnoses:  COVID-19    Rx / DC Orders ED Discharge Orders    None       Awais Cobarrubias, Barbara Cower, MD 02/13/20 2309

## 2020-02-13 NOTE — ED Notes (Signed)
Consent at bedside for monoclonal antibody infusion.

## 2020-02-15 LAB — GC/CHLAMYDIA PROBE AMP (~~LOC~~) NOT AT ARMC
Chlamydia: POSITIVE — AB
Comment: NEGATIVE
Comment: NORMAL
Neisseria Gonorrhea: NEGATIVE

## 2020-05-12 ENCOUNTER — Encounter (HOSPITAL_COMMUNITY): Payer: Self-pay | Admitting: Emergency Medicine

## 2020-05-12 ENCOUNTER — Ambulatory Visit (HOSPITAL_COMMUNITY)
Admission: EM | Admit: 2020-05-12 | Discharge: 2020-05-12 | Disposition: A | Discharge status: Home / Self Care | Payer: No Payment, Other | Attending: Psychiatry | Admitting: Psychiatry

## 2020-05-12 ENCOUNTER — Other Ambulatory Visit: Payer: Self-pay

## 2020-05-12 DIAGNOSIS — F6381 Intermittent explosive disorder: Secondary | ICD-10-CM | POA: Diagnosis not present

## 2020-05-12 NOTE — Discharge Instructions (Signed)

## 2020-05-12 NOTE — ED Triage Notes (Signed)
Patient states he has been going through a lot and wanted mom to listen and to get her attention he said he was going to kill himself but did not mean it. Patient denies SI/HI and A/V/H. Patient is under stress related to losing his job, girlfriend going blind and need surgery  And going to court for partial custody of his child.

## 2020-05-12 NOTE — Progress Notes (Signed)
Steven Rhodes received his AVS, questions answered and he is waiting for [employer] to transport him home to pick up his car to prevent a confrontation with his family.

## 2020-05-12 NOTE — BH Assessment (Signed)
Comprehensive Clinical Assessment (CCA) Note  05/12/2020 Steven Rhodes [id number]  Steven Rhodes is a 19 year old male presenting to [facility] voluntarily with [employer] due to making threats to kill himself. Patient reports having an argument with his mom and reports that his mom was not listening to him so he made the statement that he was going to kill himself. Patient reports that he made the statement for attention and he does not want to kill himself. Patient reports that he has attempted suicide once in 2018 via cutting his vein. Patient reports history of property destruction at his parents' house when he gets angry. Patient does not have any outpatient services and reports history of receiving inpatient services once for 48 hours. Patient is in the precontemplation stage of change and is not interested in mental health services at this time. Patient reports prior diagnosis of ADHD and possibly depression and not taking any psychotropic medications.    Patient is oriented to person, place and situation, he is engaged, alert and cooperative during assessment. Patient denies SI/HI/AVH/SIB and substance use but acknowledges he made suicidal statements to his mom due to her not listening to him. Patient does not have a plan and contracts for safety.    Clinician called patient aunt to inform her of the disposition and recommendations provided from Reola Calkins, NP to include discharge with resources. Collateral information also obtained from patient aunt who reports patient can not return home tonight and if he goes to his parents house they will call the police on patient. Aunt reports that patient sleeps with knifes and his parents are fearful of him due to patient having history of threatening to harm parents. Aunt states that patient has never attempted to harm parents but he has pushed his father before. Information gathered from patient aunt was shared with Feliz Beam, NP.   Disposition: Per  Reola Calkins, NP, patient is psychiatrically cleared and recommended for discharge and follow up with outpatient services. Patient will be escorted by [employer] to his parents' house to get his car so there is no conflict between patient and his parents.     Chief Complaint:  Chief Complaint  Patient presents with  . 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 Self (Phreesia 05/12/2020)  Referral name: Jacques Fife 05/12/2020)  Referral phone number: No data recorded  Whom do you see for routine medical problems? I don't have a doctor (Phreesia 05/12/2020)  Practice/Facility Name: No data recorded Practice/Facility Phone Number: No data recorded Name of Contact: No data recorded Contact Number: No data recorded Contact Fax Number: No data recorded Prescriber Name: No data recorded Prescriber Address (if known): No data recorded  What Is the Reason for Your Visit/Call Today? Self HarM Possibility (Phreesia 05/12/2020)  How Long Has This Been Causing You Problems? <Week (Phreesia 05/12/2020)  What Do You Feel Would Help You the Most Today? Assessment Only (Phreesia 05/12/2020)   Have You Recently Been in Any Inpatient Treatment (Hospital/Detox/Crisis Center/28-Day Program)? No (Phreesia 05/12/2020)  Name/Location of Program/Hospital:No data recorded How Long Were You There? No data recorded When Were You Discharged? No data recorded  Have You Ever Received Services From [facility] Before? Yes (Phreesia 05/12/2020)  Who Do You See at [facility]? Mazi (Phreesia 05/12/2020)   Have You Recently Had Any Thoughts About Hurting Yourself? Yes (Phreesia 05/12/2020)  Are You Planning to Commit Suicide/Harm  Yourself At This time? No (Phreesia 05/12/2020)   Have you Recently Had Thoughts About Hurting Someone Karolee Ohs? No (Phreesia  05/12/2020)  Explanation: No data recorded  Have You Used Any Alcohol or Drugs in the Past 24 Hours? No (Phreesia 05/12/2020)  How Long Ago Did You Use Drugs or Alcohol? No data recorded What Did You Use and How Much? No data recorded  Do You Currently Have a Therapist/Psychiatrist? No (Phreesia 05/12/2020)  Name of Therapist/Psychiatrist: No data recorded  Have You Been Recently Discharged From Any Office Practice or Programs? No (Phreesia 05/12/2020)  Explanation of Discharge From Practice/Program: No data recorded    CCA Screening Triage Referral Assessment Type of Contact: No data recorded Is this Initial or Reassessment? No data recorded Date Telepsych consult ordered in [facility]:  No data recorded Time Telepsych consult ordered in [facility]:  No data recorded  Patient Reported Information Reviewed? No data recorded Patient Left Without Being Seen? No data recorded Reason for Not Completing Assessment: No data recorded  Collateral Involvement: No data recorded  Does Patient Have a Court Appointed Legal Guardian? No data recorded Name and Contact of Legal Guardian: No data recorded If Minor and Not Living with Parent(s), Who has Custody? No data recorded Is CPS involved or ever been involved? No data recorded Is APS involved or ever been involved? No data recorded  Patient Determined To Be At Risk for Harm To Self or Others Based on Review of Patient Reported Information or Presenting Complaint? No data recorded Method: No data recorded Availability of Means: No data recorded Intent: No data recorded Notification Required: No data recorded Additional Information for Danger to Others Potential: No data recorded Additional Comments for Danger to Others Potential: No data recorded Are There Guns or Other Weapons in Your Home? No data recorded Types of Guns/Weapons: No data recorded Are These Weapons Safely Secured?                            No data recorded Who Could Verify You  Are Able To Have These Secured: No data recorded Do You Have any Outstanding Charges, Pending Court Dates, Parole/Probation? No data recorded Contacted To Inform of Risk of Harm To Self or Others: No data recorded  Location of Assessment: No data recorded  Does Patient Present under Involuntary Commitment? No data recorded IVC Papers Initial File Date: No data recorded  [place] of Residence: No data recorded  Patient Currently Receiving the Following Services: No data recorded  Determination of Need: No data recorded  Options For Referral: No data recorded    CCA Biopsychosocial Intake/Chief Complaint:  SI  Current Symptoms/Problems: conflict with his parents, property destruction   Patient Reported Schizophrenia/Schizoaffective Diagnosis in Past: No   Strengths: UTA  Preferences: UTA  Abilities: UTA   Type of Services Patient Feels are Needed: none   Initial Clinical Notes/Concerns: No data recorded  Mental Health Symptoms Depression:  None   Duration of Depressive symptoms: No data recorded  Mania:  None   Anxiety:   None   Psychosis:  None   Duration of Psychotic symptoms: No data recorded  Trauma:  None   Obsessions:  None   Compulsions:  None   Inattention:  None   Hyperactivity/Impulsivity:  N/A   Oppositional/Defiant Behaviors:  None   Emotional Irregularity:  Intense/inappropriate anger; Intense/unstable relationships; Recurrent suicidal behaviors/gestures/threats   Other Mood/Personality Symptoms:  No data recorded   Mental Status Exam Appearance  and self-care  Stature:  Average   Weight:  Average weight   Clothing:  Age-appropriate   Grooming:  Normal   Cosmetic use:  None   Posture/gait:  Normal   Motor activity:  Not Remarkable   Sensorium  Attention:  Normal   Concentration:  Normal   Orientation:  X5   Recall/memory:  Normal   Affect and Mood  Affect:  Full Range   Mood:  No data recorded  Relating  Eye  contact:  Normal   Facial expression:  Responsive   Attitude toward examiner:  Cooperative   Thought and Language  Speech flow: Clear and Coherent   Thought content:  No data recorded  Preoccupation:  None   Hallucinations:  None   Organization:  No data recorded  [employer] of Knowledge:  Good   Intelligence:  Average   Abstraction:  Normal   Judgement:  Poor   Reality Testing:  No data recorded  Insight:  Lacking   Decision Making:  Impulsive   Social Functioning  Social Maturity:  No data recorded  Social Judgement:  No data recorded  Stress  Stressors:  Family conflict   Coping Ability:  Exhausted   Skill Deficits:  No data recorded  Supports:  Family     Religion:    Leisure/Recreation:    Exercise/Diet:     CCA Employment/Education Employment/Work Situation: Employment / Work Environmental consultant job has been impacted by current illness: No Where was the patient employed at that time?: Scientist, water quality living  Education: Education Is Patient Currently Attending School?: No Did Garment/textile technologist From [employer]?: Yes Did Theme park manager?: No Did Designer, television/film set?: No Did You Have An Individualized Education Program (IIEP): No Did You Have Any Difficulty At [employer]?: No Patient's Education Has Been Impacted by Current Illness: No   CCA Family/Childhood History Family and Relationship History: Family history What is your sexual orientation?: UTA Has your sexual activity been affected by drugs, alcohol, medication, or emotional stress?: UTA Does patient have children?: Yes How many children?: 1  Childhood History:  Childhood History By whom was/is the patient raised?: Both parents Additional childhood history information: UTA Description of patient's relationship with caregiver when they were a child: UTA Patient's description of current relationship with people who raised him/her: UTA How were you disciplined when you  got in trouble as a child/adolescent?: UTA  Child/Adolescent Assessment:     CCA Substance Use Alcohol/Drug Use: Alcohol / Drug Use Pain Medications: See MAR Prescriptions: See MAR Over the Counter: See MAR History of alcohol / drug use?: No history of alcohol / drug abuse                         ASAM's:  Six Dimensions of Multidimensional Assessment  Dimension 1:  Acute Intoxication and/or Withdrawal Potential:      Dimension 2:  Biomedical Conditions and Complications:      Dimension 3:  Emotional, Behavioral, or Cognitive Conditions and Complications:     Dimension 4:  Readiness to Change:     Dimension 5:  Relapse, Continued use, or Continued Problem Potential:     Dimension 6:  Recovery/Living Environment:     ASAM Severity Score:    ASAM Recommended Level of Treatment:     Substance use Disorder (SUD)    Recommendations for Services/Supports/Treatments:    DSM5 Diagnoses: Patient Active Problem List   Diagnosis Date Noted  . Intermittent  explosive disorder in adult   . Gastroenteritis 11/27/2017  . Self-injurious behavior 05/10/2017  . 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 Depression   . Depression with anxiety 03/04/2017  . H/O sexually transmitted disease 08/23/2016  . Abdominal pain 06/25/2014  . Oppositional defiant behavior 06/18/2011  . Tinea corporis 10/02/2010  . PREMATURE BIRTH 08/29/2009  . VISUAL ACUITY, DECREASED 09/30/2007  . Attention deficit hyperactivity disorder (ADHD) 07/25/2006    Disposition: Per Reola Calkinsravis Money, NP, patient is psychiatrically cleared and recommended for discharge and follow up with outpatient services. Patient will be escorted by [employer] to his parents' house to get his car so there is no conflict between patient and his parents.    Cartina Brousseau Shirlee MoreL Claudis Giovanelli, [facility]

## 2020-05-12 NOTE — ED Provider Notes (Signed)
Behavioral Health Urgent Care Medical Screening Exam  Patient Name: Steven Rhodes MRN: [id number] Date of Evaluation: 05/12/20 Chief Complaint:   Diagnosis:  Final diagnoses:  Intermittent explosive disorder in adult    History of Present illness: Steven Rhodes is a 19 y.o. male.  [facility] the [facility] voluntarily accompanied by law enforcement.  Patient reports that he got an argument with his mother today over Brendy Ficek.  He states that he has a girlfriend that has diabetes and is going blind and she needs surgery.  He states he has been asking him to give him $300 and they do not believe him about needing Lolah Coghlan for the surgery.  He states that he got into an argument because his mother does not listen to him and continues to yell at him he made a comment about wanting to kill himself and she contacted the police.  Patient arrives here stating that he is not suicidal nor homicidal and that he has no intent or plan on harming himself or anyone else.  He states that it was just a comment he made to get his mom to stop yelling at him.  He states that he can contact his mother or his father.  Contacted both cell phone numbers in the home number and no one answered.  He provided me with his aunts number, Maryla Morrow, which is [phone] cm reports that the patient has a long history of manipulating his parents as well as other people, stealing, not maintaining a job and becomes agitated when he does not get what he wants.  She reports that he has punched holes in the walls because of not getting the Denim Start he is asking for.  She states that he has been asking numerous family members to give him the Zaraya Delauder for his girlfriend surgery, however no one truly believes in because he has lots of much in the past.  She states it is always been about Ariellah Faust when he gets into arguments with his family.  She states that they have been trying to get him to go get help because of his behavior but he  refuses to go to any outpatient treatment.  After consulting with Dr. Bronwen Betters patient does not meet criteria for inpatient psychiatric treatment.  The aunt was contacted by TTS staff and notified that the patient would be discharging and she informed her TTS staff that the parents have stated they will contact the police if he arrives at the house even to get his car.  Discussed with the patient for him to be escorted back to the house via police so that he can get his car so that he can return to work tonight as he states that is where he is going.  Patient stated understanding and agreement.  Patient is provided with outpatient resources and information for open access at the [facility] for therapy sessions.  Psychiatric Specialty Exam  Presentation  General Appearance:Appropriate for Environment; Casual  Eye Contact:Good  Speech:Clear and Coherent; Normal Rate  Speech Volume:Normal  Handedness:Right   Mood and Affect  Mood:Anxious  Affect:Appropriate; Congruent   Thought Process  Thought Processes:Coherent  Descriptions of Associations:Intact  Orientation:Full (Time, Place and Person)  Thought Content:WDL  Hallucinations:None  Ideas of Reference:None  Suicidal Thoughts:No  Homicidal Thoughts:No   Sensorium  Memory:Immediate Good; Recent Good; Remote Good  Judgment:Fair  Insight:Fair   Executive Functions  Concentration:Good  Attention Span:Good  Recall:Good  Fund of Knowledge:Good  Language:Good   Psychomotor Activity  Psychomotor Activity:Normal   Assets  Assets:Communication Skills; Housing; Desire for Improvement; Physical Health; Social Support; Transportation   Sleep  Sleep:Fair  Number of hours: No data recorded  Physical Exam: Physical Exam Vitals and nursing note reviewed.  Constitutional:      Appearance: He is well-developed.  HENT:     Head: Normocephalic.  Eyes:     Pupils: Pupils are equal, round, and reactive to light.   Cardiovascular:     Rate and Rhythm: Normal rate.  Pulmonary:     Effort: Pulmonary effort is normal.  Musculoskeletal:        General: Normal range of motion.  Neurological:     Mental Status: He is alert and oriented to person, place, and time.    Review of Systems  Constitutional: Negative.   HENT: Negative.   Eyes: Negative.   Respiratory: Negative.   Cardiovascular: Negative.   Gastrointestinal: Negative.   Genitourinary: Negative.   Musculoskeletal: Negative.   Skin: Negative.   Neurological: Negative.   Endo/Heme/Allergies: Negative.   Psychiatric/Behavioral: Negative.    Blood pressure 116/69, pulse 73, temperature 98.7 F (37.1 C), temperature source Oral, resp. rate 18, SpO2 99 %. There is no height or weight on file to calculate BMI.  Musculoskeletal: Strength & Muscle Tone: within normal limits Gait & Station: normal Patient leans: N/A   [facility] MSE Discharge Disposition for Follow up and Recommendations: Based on my evaluation the patient does not appear to have an emergency medical condition and can be discharged with resources and follow up care in outpatient services for Medication Management and Individual Therapy   Maryfrances Bunnell, FNP 05/12/2020, 5:56 PM

## 2020-06-03 ENCOUNTER — Emergency Department (HOSPITAL_COMMUNITY)
Admission: EM | Admit: 2020-06-03 | Discharge: 2020-06-04 | Disposition: A | Discharge status: Home / Self Care | Payer: Self-pay | Attending: Emergency Medicine | Admitting: Emergency Medicine

## 2020-06-03 DIAGNOSIS — R4689 Other symptoms and signs involving appearance and behavior: Secondary | ICD-10-CM

## 2020-06-03 DIAGNOSIS — R456 Violent behavior: Secondary | ICD-10-CM | POA: Insufficient documentation

## 2020-06-03 DIAGNOSIS — Z046 Encounter for general psychiatric examination, requested by authority: Secondary | ICD-10-CM | POA: Insufficient documentation

## 2020-06-03 DIAGNOSIS — F4323 Adjustment disorder with mixed anxiety and depressed mood: Secondary | ICD-10-CM | POA: Insufficient documentation

## 2020-06-03 DIAGNOSIS — F909 Attention-deficit hyperactivity disorder, unspecified type: Secondary | ICD-10-CM | POA: Insufficient documentation

## 2020-06-03 DIAGNOSIS — Z20822 Contact with and (suspected) exposure to covid-19: Secondary | ICD-10-CM | POA: Insufficient documentation

## 2020-06-03 DIAGNOSIS — F418 Other specified anxiety disorders: Secondary | ICD-10-CM | POA: Diagnosis present

## 2020-06-03 DIAGNOSIS — F6381 Intermittent explosive disorder: Secondary | ICD-10-CM | POA: Diagnosis present

## 2020-06-03 LAB — CBC
HCT: 45.4 % (ref 39.0–52.0)
Hemoglobin: 14.6 g/dL (ref 13.0–17.0)
MCH: 28.1 pg (ref 26.0–34.0)
MCHC: 32.2 g/dL (ref 30.0–36.0)
MCV: 87.5 fL (ref 80.0–100.0)
Platelets: 292 10*3/uL (ref 150–400)
RBC: 5.19 MIL/uL (ref 4.22–5.81)
RDW: 12.9 % (ref 11.5–15.5)
WBC: 7.4 10*3/uL (ref 4.0–10.5)
nRBC: 0 % (ref 0.0–0.2)

## 2020-06-03 LAB — COMPREHENSIVE METABOLIC PANEL
ALT: 34 U/L (ref 0–44)
AST: 27 U/L (ref 15–41)
Albumin: 4.4 g/dL (ref 3.5–5.0)
Alkaline Phosphatase: 69 U/L (ref 38–126)
Anion gap: 12 (ref 5–15)
BUN: 15 mg/dL (ref 6–20)
CO2: 24 mmol/L (ref 22–32)
Calcium: 9.8 mg/dL (ref 8.9–10.3)
Chloride: 103 mmol/L (ref 98–111)
Creatinine, Ser: 0.99 mg/dL (ref 0.61–1.24)
GFR, Estimated: >60 mL/min (ref >60)
Glucose, Bld: 96 mg/dL (ref 70–99)
Potassium: 3.7 mmol/L (ref 3.5–5.1)
Sodium: 139 mmol/L (ref 135–145)
Total Bilirubin: 0.7 mg/dL (ref 0.3–1.2)
Total Protein: 7.9 g/dL (ref 6.5–8.1)

## 2020-06-03 LAB — RAPID URINE DRUG SCREEN, HOSP PERFORMED
Amphetamines: NOT DETECTED
Barbiturates: NOT DETECTED
Benzodiazepines: NOT DETECTED
Cocaine: NOT DETECTED
Opiates: NOT DETECTED
Tetrahydrocannabinol: NOT DETECTED

## 2020-06-03 LAB — ETHANOL: Alcohol, Ethyl (B): <10 mg/dL (ref <10)

## 2020-06-03 LAB — SALICYLATE LEVEL: Salicylate Lvl: <7 mg/dL — ABNORMAL LOW (ref 7.0–30.0)

## 2020-06-03 LAB — ACETAMINOPHEN LEVEL: Acetaminophen (Tylenol), Serum: <10 ug/mL — ABNORMAL LOW (ref 10–30)

## 2020-06-03 NOTE — BH Assessment (Signed)
Eulis Foster, RN to fax pt's IVC paperwork. Once paperwork is received and pt is placed in a private room, clinician to engage pt in TTS assessment.   Redmond Pulling, MS, [facility], [facility] Triage Specialist [phone]

## 2020-06-03 NOTE — BH Assessment (Signed)
IVC paperwork is received. Once pt is in a private room clinician will complete his TTS assessment.    Redmond Pulling, MS, [facility], [facility] Triage Specialist [phone]

## 2020-06-03 NOTE — ED Provider Notes (Signed)
[facility] EMERGENCY DEPARTMENT Provider Note   CSN: [id number] Arrival date & time: 06/03/20  1422     History Chief Complaint  Patient presents with  . Medical Clearance    Steven Rhodes is a 20 y.o. male.  Patient with history of suicidal thoughts.  IVC filled by family due to aggressive behavior.  The history is provided by the patient.  Mental Health Problem Presenting symptoms: aggressive behavior   Presenting symptoms: no suicidal thoughts   Patient accompanied by:  Law enforcement Associated symptoms: poor judgment   Associated symptoms: no abdominal pain, no anxiety and no chest pain   Risk factors: no hx of mental illness        Past Medical History:  Diagnosis Date  . ADHD (attention deficit hyperactivity disorder)   . Fracture, radius, distal 03/16/11  . FRACTURED TOOTH 08/29/2009   Qualifier: Diagnosis of  By: Sheffield Slider MD, Deniece Portela    . Premature birth     Patient Active Problem List   Diagnosis Date Noted  . 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 Gastroenteritis 11/27/2017  . Self-injurious behavior 05/10/2017  . 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 Depression   . Depression with anxiety 03/04/2017  . H/O sexually transmitted disease 08/23/2016  . Abdominal pain 06/25/2014  . Oppositional defiant behavior 06/18/2011  . Tinea corporis 10/02/2010  . PREMATURE BIRTH 08/29/2009  . VISUAL ACUITY, DECREASED 09/30/2007  . Attention deficit hyperactivity disorder (ADHD) 07/25/2006    Past Surgical History:  Procedure Laterality Date  . ORCHIOPEXY  05/09/2012   Procedure: ORCHIOPEXY PEDIATRIC;  Surgeon: Valetta Fuller, MD;  Location: [facility] OR;  Service: Urology;  Laterality: Bilateral;  . TESTICLE TORSION REDUCTION  05/09/2012   Procedure: TESTICULAR TORSION REPAIR;  Surgeon: Valetta Fuller, MD;  Location: [facility] OR;  Service: Urology;  Laterality: N/A;       Family History  Problem  Relation Age of Onset  . Healthy Mother   . Diabetes Father   . Hypertension Father     Social History   Tobacco Use  . Smoking status: Never Smoker  . Smokeless tobacco: Never Used  Vaping Use  . Vaping Use: Some days  Substance Use Topics  . Alcohol use: Never  . Drug use: Never    Home Medications Prior to Admission medications   Medication Sig Start Date End Date Taking? Authorizing Provider  benzonatate (TESSALON) 100 MG capsule Take 1-2 capsules (100-200 mg total) by mouth 3 (three) times daily as needed for cough. 10/17/19   Wallis Bamberg, PA-C  cetirizine (ZYRTEC ALLERGY) 10 MG tablet Take 1 tablet (10 mg total) by mouth daily. 10/17/19   Wallis Bamberg, PA-C  promethazine-dextromethorphan (PROMETHAZINE-DM) 6.25-15 MG/5ML syrup Take 5 mLs by mouth at bedtime as needed for cough. 10/17/19   Wallis Bamberg, PA-C  pseudoephedrine (SUDAFED) 60 MG tablet Take 1 tablet (60 mg total) by mouth every 8 (eight) hours as needed for congestion. 10/17/19   Wallis Bamberg, PA-C    Allergies    Patient has no known allergies.  Review of Systems   Review of Systems  Constitutional: Negative for chills and fever.  HENT: Negative for ear pain and sore throat.   Eyes: Negative for pain and visual disturbance.  Respiratory: Negative for cough and shortness of breath.   Cardiovascular: Negative for chest pain and palpitations.  Gastrointestinal: Negative for abdominal pain and vomiting.  Genitourinary: Negative for dysuria and hematuria.  Musculoskeletal:  Negative for arthralgias and back pain.  Skin: Negative for color change and rash.  Neurological: Negative for seizures and syncope.  Psychiatric/Behavioral: Negative for suicidal ideas. The patient is not nervous/anxious.   All other systems reviewed and are negative.   Physical Exam Updated Vital Signs BP 120/71 (BP Location: Right Arm)   Pulse 61   Temp 98.6 F (37 C) (Oral)   Resp 16   Ht 5\' 11"  (1.803 m)   Wt 104.3 kg   SpO2 100%    BMI 32.08 kg/m   Physical Exam Vitals and nursing note reviewed.  Constitutional:      Appearance: He is well-developed and well-nourished.  HENT:     Head: Normocephalic and atraumatic.  Eyes:     Extraocular Movements: Extraocular movements intact.     Conjunctiva/sclera: Conjunctivae normal.  Cardiovascular:     Rate and Rhythm: Normal rate and regular rhythm.     Heart sounds: No murmur heard.   Pulmonary:     Effort: Pulmonary effort is normal. No respiratory distress.     Breath sounds: Normal breath sounds.  Abdominal:     Palpations: Abdomen is soft.     Tenderness: There is no abdominal tenderness.  Musculoskeletal:        General: No edema.     Cervical back: Neck supple.  Skin:    General: Skin is warm and dry.  Neurological:     Mental Status: He is alert.  Psychiatric:        Mood and Affect: Mood and affect and mood normal.        Behavior: Behavior normal.        Thought Content: Thought content normal. Thought content does not include homicidal or suicidal ideation. Thought content does not include homicidal or suicidal plan.        Judgment: Judgment normal.     ED Results / Procedures / Treatments   Labs (all labs ordered are listed, but only abnormal results are displayed) Labs Reviewed  SALICYLATE LEVEL - Abnormal; Notable for the following components:      Result Value   Salicylate Lvl <4.8 (*)    All other components within normal limits  ACETAMINOPHEN LEVEL - Abnormal; Notable for the following components:   Acetaminophen (Tylenol), Serum <10 (*)    All other components within normal limits  COMPREHENSIVE METABOLIC PANEL  ETHANOL  CBC  RAPID URINE DRUG SCREEN, HOSP PERFORMED    EKG None  Radiology No results found.  Procedures Procedures (including critical care time)  Medications Ordered in ED Medications - No data to display  ED Course  I have reviewed the triage vital signs and the nursing notes.  Pertinent labs & imaging  results that were available during my care of the patient were reviewed by me and considered in my medical decision making (see chart for details).    MDM Rules/Calculators/A&P                          [facility].  It seems that IVC was filled out for aggressive behavior.  History of self-harm in the past but not on any medication.  Denies any suicidal homicidal ideation.  Suspect that aggressive behavior was secondary to argument with parents but they fill out IVC as I do not feel like he is safe.  Will have patient evaluated by psychiatry.  He is medically cleared.  Lab work reassuring.  This  chart was dictated using voice recognition software.  Despite best efforts to proofread,  errors can occur which can change the documentation meaning.   Final Clinical Impression(s) / ED Diagnoses Final diagnoses:  Aggressive behavior    Rx / DC Orders ED Discharge Orders    None       Virgina Norfolk, DO 06/03/20 2004
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
## 2020-06-03 NOTE — ED Notes (Signed)
Pts been wanded 

## 2020-06-03 NOTE — ED Triage Notes (Signed)
Pt arrives via [facility] to ER with IVC paperwork taken out by mother. Pt reports argument with Father last night. Per paperwork pt has hx of cutting, stated he wanted to hurt his family, was breaking things and punching holes in the wall. Pt awake, alert, calm, cooperative. VSS.

## 2020-06-04 DIAGNOSIS — F6381 Intermittent explosive disorder: Secondary | ICD-10-CM

## 2020-06-04 LAB — RESP PANEL BY RT-PCR (FLU A&B, COVID) ARPGX2
Influenza A by PCR: NEGATIVE
Influenza B by PCR: NEGATIVE
SARS Coronavirus 2 by RT PCR: NEGATIVE

## 2020-06-04 MED ORDER — HYDROXYZINE HCL 25 MG PO TABS: 25.0000 mg | ORAL_TABLET | Freq: Three times a day (TID) | ORAL | 0 refills | Status: AC | PRN

## 2020-06-04 NOTE — Progress Notes (Signed)
CSW received request to discuss outpatient resources for behavioral health. CSW met with patient in lobby post-discharge and provided list of local providers for behavioral health that see patient's with no insurance or sliding fee scales. CSW discussed [facility] and [facility] depending on residence and provided information on behavioral health urgent care to address situations they arise where he feels he needs additional support. CSW inquired if patient needed anything else and noted patient declined at this time.

## 2020-06-04 NOTE — ED Notes (Signed)
Spoke with [facility]. Pt awaiting Psychiatry to eval. Spoke with patient regarding plan of care. Pt frustrated but compliant. Provided food and telephone. NADN
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
## 2020-06-04 NOTE — ED Notes (Signed)
Spoke to Pt's mother with assistance of translator with permission from Pt.

## 2020-06-04 NOTE — ED Notes (Signed)
Spoke with Pt's mother, Rogelia. Rogelia states that she has no safety concerns about IVC being rescinded and pt being discharge home. She does not feel in danger and believes pt is not a threat to self or other.

## 2020-06-04 NOTE — ED Notes (Signed)
In triage; Breakfast Order placed

## 2020-06-04 NOTE — Discharge Instructions (Signed)
Please call the number above to schedule a follow up appointment with the behavioral health team. Our team felt that you would benefit from outpatient behavioral health therapy. If you ever have any acute psychiatric concerns, including feeling suicidal or severely depressed, you can call the number above or visit their walk-in clinic.  Our psychiatry team recommended starting you on hydroxyzine, a medicine that can help with agitation and severe anxiety attacks.  You can take this AS NEEDED once every 8 hours at home.  Do not take more than prescribed.  You do not need to take this every day if you are feeling calm.

## 2020-06-04 NOTE — ED Provider Notes (Signed)
Patient reevaluated by behavioral health team.  I discussed re-evaluation with Ophelia Shoulder, NP, from [facility] and Delton Prairie, patient's RN, will obtain collateral information from the patient's mother. She has no acute safety concerns at this time. With this collateral information, the psychiatry team feels that the patient can be safely discharged home, and recommended beginning hydroxyzine 25 mg 3 times daily as needed for agitation. Patient has remained calm and directable during his stay in the ED today. We advised outpatient follow-up w/ [facility].  IVC rescinded.  Will d/c.   Terald Sleeper, MD 06/04/20 1254

## 2020-06-04 NOTE — ED Notes (Signed)
Spoke with Pt's mother,

## 2020-11-17 ENCOUNTER — Other Ambulatory Visit: Payer: Self-pay

## 2020-11-17 ENCOUNTER — Ambulatory Visit (HOSPITAL_COMMUNITY)
Admission: EM | Admit: 2020-11-17 | Discharge: 2020-11-17 | Disposition: A | Discharge status: Home / Self Care | Payer: Self-pay | Attending: Family Medicine | Admitting: Family Medicine

## 2020-11-17 ENCOUNTER — Encounter (HOSPITAL_COMMUNITY): Payer: Self-pay

## 2020-11-17 ENCOUNTER — Ambulatory Visit (HOSPITAL_COMMUNITY): Payer: Self-pay

## 2020-11-17 DIAGNOSIS — H10502 Unspecified blepharoconjunctivitis, left eye: Secondary | ICD-10-CM

## 2020-11-17 DIAGNOSIS — H00015 Hordeolum externum left lower eyelid: Secondary | ICD-10-CM

## 2020-11-17 HISTORY — DX: Torsion of testis, unspecified: N44.00

## 2020-11-17 MED ORDER — ERYTHROMYCIN 5 MG/GM OP OINT: TOPICAL_OINTMENT | OPHTHALMIC | 0 refills | Status: DC

## 2020-11-17 NOTE — ED Provider Notes (Signed)
[facility]   [id number] 11/17/20 Arrival Time: 1450  CC: EYE REDNESS  SUBJECTIVE:  Steven Rhodes is a 20 y.o. male who presents with complaint of eye redness that began about a week. Denies a precipitating event, trauma, or close contacts with similar symptoms. Has not tried OTC medications for this. Symptoms are made worse with palpation. Denies similar symptoms in the past. Denies fever, chills, nausea, vomiting, eye pain, painful eye movements, halos, discharge, itching, vision changes, double vision.   Denies contact lens use.    ROS: As per HPI.  All other pertinent ROS negative.     Past Medical History:  Diagnosis Date   Testicular torsion    Past Surgical History:  Procedure Laterality Date   SURGERY SCROTAL / TESTICULAR     No Known Allergies No current facility-administered medications on file prior to encounter.   No current outpatient medications on file prior to encounter.   Social History   Socioeconomic History   Marital status: Single    Spouse name: Not on file   Number of children: Not on file   Years of education: Not on file   Highest education level: Not on file  Occupational History   Not on file  Tobacco Use   Smoking status: Never   Smokeless tobacco: Never  Vaping Use   Vaping Use: Never used  Substance and Sexual Activity   Alcohol use: Yes   Drug use: Never   Sexual activity: Not on file  Other Topics Concern   Not on file  Social History Narrative   Not on file   Social Determinants of Health   Financial Resource Strain: Not on file  Food Insecurity: Not on file  Transportation Needs: Not on file  Physical Activity: Not on file  Stress: Not on file  Social Connections: Not on file  Intimate Partner Violence: Not on file   Family History  Problem Relation Age of Onset   Hypertension Mother    Diabetes Mother    Hypertension Father    Diabetes Father    Heart Problems Father     OBJECTIVE:         Vitals:   11/17/20 1529  BP: 122/66  Pulse: 70  Resp: 17  Temp: 99.1 F (37.3 C)  TempSrc: Oral  SpO2: 97%    General appearance: alert; no distress Eyes: No conjunctival erythema. PERRL; EOMI without discomfort;  no obvious drainage; lid everted without obvious FB; left lower lid with hordeolum, erythema, tenderness Neck: supple Lungs: clear to auscultation bilaterally Heart: regular rate and rhythm Skin: warm and dry Psychological: alert and cooperative; normal mood and affect   ASSESSMENT & PLAN:  1. Blepharoconjunctivitis of left eye, unspecified blepharoconjunctivitis type   2. Hordeolum externum of left lower eyelid     Meds ordered this encounter  Medications   erythromycin ophthalmic ointment    Sig: Place a 1/2 inch ribbon of ointment into the lower eyelid.    Dispense:  3.5 g    Refill:  0    Order Specific Question:   Supervising Provider    Answer:   Merrilee Jansky [id number]   STYE: Continue warm compresses at home.  Soak a wash cloth in warm (not scalding) water and place it over the eyes. As the wash cloth cools, it should be rewarmed and replaced for a total of 5 to 10 minutes of soaking time. Warm compresses should be applied two to four times a day as long  as the patient has symptoms Perform lid washing: Either warm water or very dilute baby shampoo can be placed on a clean wash cloth, gauze pad, or cotton swab. Then be advised to gently clean along the lashes and lid margin to remove the accumulated material with care to avoid contacting the ocular surface. If shampoo is used, thorough rinsing is recommended. Vigorous washing should be avoided, as it may cause more irritation.  Prescribed erythromycin ointment.  Apply up to 6 times daily for 5-7 days, or until symptomatic improvement Follow up with ophthalmology for further evaluation and management if symptoms persists Return or go to ER if you have any new or worsening symptoms such as fever, chills,  redness, swelling, eye pain, painful eye movements, vision changes.  Reviewed expectations re: course of current medical issues. Questions answered. Outlined signs and symptoms indicating need for more acute intervention. Patient verbalized understanding. After Visit Summary given.   Moshe Cipro, NP 11/17/20 1551

## 2020-11-17 NOTE — Discharge Instructions (Addendum)
I have sent in erythromycin ointment for you to use   May use lacri lube ointment during the day as needed  Follow up with this office or with primary care if symptoms are persisting.  Follow up in the ER for high fever, trouble swallowing, trouble breathing, other concerning symptoms.

## 2020-11-17 NOTE — ED Triage Notes (Signed)
Pt reports swelling and pain in the left lower eyelid x 1 week.

## 2020-11-18 ENCOUNTER — Encounter (HOSPITAL_COMMUNITY): Payer: Self-pay | Admitting: Emergency Medicine

## 2020-11-21 ENCOUNTER — Other Ambulatory Visit: Payer: Self-pay

## 2020-11-21 ENCOUNTER — Ambulatory Visit (HOSPITAL_COMMUNITY)
Admission: EM | Admit: 2020-11-21 | Discharge: 2020-11-21 | Disposition: A | Discharge status: Home / Self Care | Payer: Self-pay | Attending: Physician Assistant | Admitting: Physician Assistant

## 2020-11-21 ENCOUNTER — Encounter (HOSPITAL_COMMUNITY): Payer: Self-pay

## 2020-11-21 ENCOUNTER — Ambulatory Visit: Payer: Self-pay

## 2020-11-21 ENCOUNTER — Ambulatory Visit (HOSPITAL_COMMUNITY): Payer: Self-pay

## 2020-11-21 DIAGNOSIS — R519 Headache, unspecified: Secondary | ICD-10-CM | POA: Insufficient documentation

## 2020-11-21 DIAGNOSIS — R0981 Nasal congestion: Secondary | ICD-10-CM | POA: Insufficient documentation

## 2020-11-21 DIAGNOSIS — R197 Diarrhea, unspecified: Secondary | ICD-10-CM | POA: Insufficient documentation

## 2020-11-21 DIAGNOSIS — Z20822 Contact with and (suspected) exposure to covid-19: Secondary | ICD-10-CM | POA: Insufficient documentation

## 2020-11-21 DIAGNOSIS — J069 Acute upper respiratory infection, unspecified: Secondary | ICD-10-CM | POA: Insufficient documentation

## 2020-11-21 DIAGNOSIS — R509 Fever, unspecified: Secondary | ICD-10-CM | POA: Insufficient documentation

## 2020-11-21 DIAGNOSIS — Z79899 Other long term (current) drug therapy: Secondary | ICD-10-CM | POA: Insufficient documentation

## 2020-11-21 LAB — POC INFLUENZA A AND B ANTIGEN (URGENT CARE ONLY)
INFLUENZA A ANTIGEN, POC: NEGATIVE
INFLUENZA B ANTIGEN, POC: NEGATIVE

## 2020-11-21 MED ORDER — PROMETHAZINE-DM 6.25-15 MG/5ML PO SYRP: 5.0000 mL | ORAL_SOLUTION | Freq: Two times a day (BID) | ORAL | 0 refills | Status: DC | PRN

## 2020-11-21 NOTE — Discharge Instructions (Addendum)
Your flu test was negative.  We will contact you if your COVID test is positive.  Please drink plenty of fluid and use over-the-counter medications including Mucinex and Flonase for symptom relief.  I called you in a cough medicine that will help you sleep.  You should not take additional multisymptom medication such as NyQuil and DayQuil while taking this.  You should not drive or drink alcohol while taking this as drowsiness is a common side effect.  If you have any worsening symptoms please return for reevaluation.

## 2020-11-21 NOTE — ED Triage Notes (Signed)
Pt in with c/o congestion, headache, body aches, and diarrhea x 3 days  Pt also c/o fever tmax 104  Pt also c/o left eye stye and swelling for a few days   Pt has been taking tylenol for sx

## 2020-11-21 NOTE — ED Provider Notes (Signed)
[facility]    CSN: [id number] Arrival date & time: 11/21/20  1846      History   Chief Complaint Chief Complaint  Patient presents with   Nasal Congestion   Diarrhea   Headache   Stye    HPI Steven Rhodes is a 20 y.o. male.   Patient presents today with a 3 to 4-day history of URI symptoms.  Reports nasal congestion, headache, body aches, diarrhea, cough, fever with T-max of 104 F.  He denies any chest pain, shortness of breath, nausea, vomiting.  He has taken Tylenol with temporary improvement of symptoms.  He denies any recent antibiotic use.  He denies known sick contacts though he was exposed to someone who recently recovered from COVID-19; believes they completed quarantine.  He denies any recent influenza or COVID-19 vaccinations.  He denies history of asthma or COPD; does vape and occasionally smokes cigars.  He was seen on 11/18/2020 for stye and given erythromycin ointment; reports significant improvement but not resolution of symptoms with use of this medication.  He has missed work as result of symptoms and is requesting work excuse note today.   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   Depression    Depression with anxiety 03/04/2017   H/O sexually transmitted disease 08/23/2016   Abdominal pain 06/25/2014   Oppositional defiant behavior 06/18/2011   Tinea corporis 10/02/2010   PREMATURE BIRTH 08/29/2009   VISUAL ACUITY, DECREASED 09/30/2007   Attention deficit hyperactivity disorder (ADHD)  07/25/2006    Past Surgical History:  Procedure Laterality Date   ORCHIOPEXY  05/09/2012   Procedure: ORCHIOPEXY PEDIATRIC;  Surgeon: Valetta Fuller, MD;  Location: [facility] OR;  Service: Urology;  Laterality: Bilateral;   SURGERY SCROTAL / TESTICULAR     TESTICLE TORSION REDUCTION  05/09/2012   Procedure: TESTICULAR TORSION REPAIR;  Surgeon: Valetta Fuller, MD;  Location: [facility] OR;  Service: Urology;  Laterality: N/A;       Home Medications    Prior to Admission medications   Medication Sig Start Date End Date Taking? Authorizing Provider  promethazine-dextromethorphan (PROMETHAZINE-DM) 6.25-15 MG/5ML syrup Take 5 mLs by mouth 2 (two) times daily as needed for cough. 11/21/20  Yes Bethania Schlotzhauer, Noberto Retort, PA-C  cetirizine (ZYRTEC ALLERGY) 10 MG tablet Take 1 tablet (10 mg total) by mouth daily. Patient taking differently: Take 10 mg by mouth daily as needed for allergies. 10/17/19   Wallis Bamberg, PA-C  erythromycin ophthalmic ointment Place a 1/2 inch ribbon of ointment into the lower eyelid. 11/17/20   Moshe Cipro, NP    Family History Family History  Problem Relation Age of Onset   Hypertension Mother    Diabetes Mother    Hypertension Father    Diabetes Father    Heart Problems Father    Healthy Mother     Social History Social History   Tobacco Use   Smoking status: Never   Smokeless tobacco: Never  Vaping Use   Vaping Use: Never used  Substance Use Topics  Alcohol use: Yes   Drug use: Never     Allergies   Patient has no known allergies.   Review of Systems Review of Systems  Constitutional:  Positive for activity change and fever. Negative for appetite change and fatigue.  HENT:  Positive for rhinorrhea and sneezing. Negative for congestion, sinus pressure and sore throat.   Respiratory:  Positive for cough. Negative for shortness of breath.   Cardiovascular:  Negative for chest pain.  Gastrointestinal:  Positive for diarrhea. Negative for abdominal pain, nausea  and vomiting.  Musculoskeletal:  Positive for arthralgias and myalgias.  Neurological:  Positive for headaches. Negative for dizziness and light-headedness.    Physical Exam Triage Vital Signs ED Triage Vitals  Enc Vitals Group     BP 11/21/20 1930 127/69     Pulse Rate 11/21/20 1930 63     Resp 11/21/20 1930 16     Temp 11/21/20 1930 99 F (37.2 C)     Temp Source 11/21/20 1930 Oral     SpO2 11/21/20 1930 98 %     Weight --      Height --      Head Circumference --      Peak Flow --      Pain Score 11/21/20 1927 9     Pain Loc --      Pain Edu? --      Excl. in GC? --    No data found.  Updated Vital Signs BP 127/69 (BP Location: Right Arm)   Pulse 63   Temp 99 F (37.2 C) (Oral)   Resp 16   SpO2 98%   Visual Acuity Right Eye Distance:   Left Eye Distance:   Bilateral Distance:    Right Eye Near:   Left Eye Near:    Bilateral Near:     Physical Exam Vitals reviewed.  Constitutional:      General: He is awake.     Appearance: Normal appearance. He is normal weight. He is not ill-appearing.     Comments: Very pleasant male appears stated age in no acute distress  HENT:     Head: Normocephalic and atraumatic.     Right Ear: Tympanic membrane, ear canal and external ear normal. Tympanic membrane is not erythematous or bulging.     Left Ear: Tympanic membrane, ear canal and external ear normal. Tympanic membrane is not erythematous or bulging.     Nose:     Right Sinus: Maxillary sinus tenderness and frontal sinus tenderness present.     Left Sinus: Maxillary sinus tenderness and frontal sinus tenderness present.     Mouth/Throat:     Pharynx: Uvula midline. No oropharyngeal exudate or posterior oropharyngeal erythema.     Tonsils: No tonsillar exudate or tonsillar abscesses. 2+ on the right. 2+ on the left.  Eyes:     General:        Right eye: No hordeolum.        Left eye: No hordeolum.     Extraocular Movements: Extraocular movements intact.      Conjunctiva/sclera: Conjunctivae normal.     Pupils: Pupils are equal, round, and reactive to light.  Cardiovascular:     Rate and Rhythm: Normal rate and regular rhythm.     Heart sounds: Normal heart sounds, S1 normal and S2 normal. No murmur heard. Pulmonary:     Effort: Pulmonary effort is normal. No accessory muscle usage or respiratory distress.     Breath sounds: Normal breath sounds.  No stridor. No wheezing, rhonchi or rales.     Comments: Clear to auscultation bilaterally Abdominal:     General: Bowel sounds are normal.     Palpations: Abdomen is soft.     Tenderness: There is no abdominal tenderness.  Lymphadenopathy:     Head:     Right side of head: No submental, submandibular or tonsillar adenopathy.     Left side of head: No submental, submandibular or tonsillar adenopathy.     Cervical: No cervical adenopathy.  Neurological:     Mental Status: He is alert.  Psychiatric:        Behavior: Behavior is cooperative.     UC Treatments / Results  Labs (all labs ordered are listed, but only abnormal results are displayed) Labs Reviewed  SARS CORONAVIRUS 2 (TAT 6-24 HRS)  POC INFLUENZA A AND B ANTIGEN (URGENT CARE ONLY)    EKG   Radiology No results found.  Procedures Procedures (including critical care time)  Medications Ordered in UC Medications - No data to display  Initial Impression / Assessment and Plan / UC Course  I have reviewed the triage vital signs and the nursing notes.  Pertinent labs & imaging results that were available during my care of the patient were reviewed by me and considered in my medical decision making (see chart for details).      Influenza testing was negative in office today.  COVID-19 test is pending.  Discussed likely viral etiology given short duration of symptoms.  Patient was encouraged to use over-the-counter medications including Mucinex, Flonase, and alternate Tylenol ibuprofen for fever/pain/headache relief.  He was  prescribed Promethazine DM with instruction to drive drink alcohol with this medication as drowsiness is a common side effect.  Recommended he rest and drink plenty of fluid until symptoms resolve.  He was provided a work excuse note with current CDC return to work guidelines.  Discussed alarm symptoms that warrant emergent evaluation.  Strict return precautions given to which patient expressed understanding  Final Clinical Impressions(s) / UC Diagnoses   Final diagnoses:  Upper respiratory tract infection, unspecified type  Nasal congestion     Discharge Instructions      Your flu test was negative.  We will contact you if your COVID test is positive.  Please drink plenty of fluid and use over-the-counter medications including Mucinex and Flonase for symptom relief.  I called you in a cough medicine that will help you sleep.  You should not take additional multisymptom medication such as NyQuil and DayQuil while taking this.  You should not drive or drink alcohol while taking this as drowsiness is a common side effect.  If you have any worsening symptoms please return for reevaluation.     ED Prescriptions     Medication Sig Dispense Auth. Provider   promethazine-dextromethorphan (PROMETHAZINE-DM) 6.25-15 MG/5ML syrup Take 5 mLs by mouth 2 (two) times daily as needed for cough. 118 mL Jobany Montellano K, PA-C      PDMP not reviewed this encounter.   Jeani Hawking, PA-C 11/21/20 2036

## 2020-11-22 LAB — SARS CORONAVIRUS 2 (TAT 6-24 HRS): SARS Coronavirus 2: NEGATIVE

## 2020-11-30 ENCOUNTER — Ambulatory Visit (HOSPITAL_COMMUNITY): Payer: Self-pay

## 2020-12-01 ENCOUNTER — Other Ambulatory Visit: Payer: Self-pay

## 2020-12-01 ENCOUNTER — Ambulatory Visit (HOSPITAL_COMMUNITY): Admit: 2020-12-01 | Payer: Self-pay

## 2020-12-01 ENCOUNTER — Ambulatory Visit (HOSPITAL_COMMUNITY)
Admission: EM | Admit: 2020-12-01 | Discharge: 2020-12-01 | Disposition: A | Discharge status: Home / Self Care | Payer: Self-pay | Attending: Emergency Medicine | Admitting: Emergency Medicine

## 2020-12-01 ENCOUNTER — Encounter (HOSPITAL_COMMUNITY): Payer: Self-pay | Admitting: Emergency Medicine

## 2020-12-01 DIAGNOSIS — G43009 Migraine without aura, not intractable, without status migrainosus: Secondary | ICD-10-CM | POA: Insufficient documentation

## 2020-12-01 DIAGNOSIS — R42 Dizziness and giddiness: Secondary | ICD-10-CM | POA: Insufficient documentation

## 2020-12-01 DIAGNOSIS — Z20822 Contact with and (suspected) exposure to covid-19: Secondary | ICD-10-CM | POA: Insufficient documentation

## 2020-12-01 LAB — COMPREHENSIVE METABOLIC PANEL
ALT: 39 U/L (ref 0–44)
AST: 29 U/L (ref 15–41)
Albumin: 4.2 g/dL (ref 3.5–5.0)
Alkaline Phosphatase: 70 U/L (ref 38–126)
Anion gap: 10 (ref 5–15)
BUN: 17 mg/dL (ref 6–20)
CO2: 25 mmol/L (ref 22–32)
Calcium: 9.4 mg/dL (ref 8.9–10.3)
Chloride: 104 mmol/L (ref 98–111)
Creatinine, Ser: 0.86 mg/dL (ref 0.61–1.24)
GFR, Estimated: >60 mL/min (ref >60)
Glucose, Bld: 115 mg/dL — ABNORMAL HIGH (ref 70–99)
Potassium: 3.6 mmol/L (ref 3.5–5.1)
Sodium: 139 mmol/L (ref 135–145)
Total Bilirubin: 0.6 mg/dL (ref 0.3–1.2)
Total Protein: 7.4 g/dL (ref 6.5–8.1)

## 2020-12-01 LAB — CBC
HCT: 44.8 % (ref 39.0–52.0)
Hemoglobin: 14.9 g/dL (ref 13.0–17.0)
MCH: 28.2 pg (ref 26.0–34.0)
MCHC: 33.3 g/dL (ref 30.0–36.0)
MCV: 84.7 fL (ref 80.0–100.0)
Platelets: 311 10*3/uL (ref 150–400)
RBC: 5.29 MIL/uL (ref 4.22–5.81)
RDW: 13 % (ref 11.5–15.5)
WBC: 8.4 10*3/uL (ref 4.0–10.5)
nRBC: 0 % (ref 0.0–0.2)

## 2020-12-01 LAB — TSH: TSH: 2.642 u[IU]/mL (ref 0.350–4.500)

## 2020-12-01 MED ORDER — SUMATRIPTAN SUCCINATE 50 MG PO TABS: 50.0000 mg | ORAL_TABLET | ORAL | 0 refills | Status: DC | PRN

## 2020-12-01 NOTE — Discharge Instructions (Addendum)
Follow instructions of bottle for use of headache medication   Covid test pending 24 hours, you will be called if positive   Blood work  to check electrolytes, kidney, liver, thyroid and blood cells pending, you will be called if concerning   PCP referral will reach out to help you find a doctor  At any point if you pass out, lightheadedness worsens please go to nearest emergency department

## 2020-12-01 NOTE — ED Triage Notes (Signed)
6/26 did take a home covid test and it was positive  Was tested here, and tested negative  Patient needs to be tested for work Patient complains of headache

## 2020-12-01 NOTE — ED Provider Notes (Signed)
[facility]    CSN: [id number] Arrival date & time: 12/01/20  1355      History   Chief Complaint Chief Complaint  Patient presents with   Headache    HPI Percival Glasheen is a 20 y.o. male.   Patient presents with intermittent lightheadedness present for one month. Denies passing out, dizziness, shortness of breath, chest pain, diaphoresis. Eats only one large meal a day. Does not drink a lot of fluids, uses IV hydration packets to maintain hydration. Sleeps 8 hours a day but says mattress isn't the best. Currently working three jobs.   Concerned about intermittent left side headache that feels like throbbing. Worsened occassionally by light but not loud noises. Can cause him to become nausea at times. Tried otc medication with no relief. 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   Depression    Depression with anxiety 03/04/2017   H/O sexually transmitted disease 08/23/2016   Abdominal pain 06/25/2014   Oppositional defiant behavior 06/18/2011   Tinea corporis 10/02/2010   PREMATURE BIRTH 08/29/2009   VISUAL ACUITY, DECREASED 09/30/2007   Attention deficit hyperactivity disorder (ADHD) 07/25/2006    Past Surgical History:  Procedure Laterality Date   ORCHIOPEXY  05/09/2012   Procedure: ORCHIOPEXY PEDIATRIC;  Surgeon: Valetta Fuller, MD;  Location: [facility] OR;  Service: Urology;  Laterality: Bilateral;   SURGERY SCROTAL / TESTICULAR     TESTICLE TORSION REDUCTION   05/09/2012   Procedure: TESTICULAR TORSION REPAIR;  Surgeon: Valetta Fuller, MD;  Location: [facility] OR;  Service: Urology;  Laterality: N/A;       Home Medications    Prior to Admission medications   Medication Sig Start Date End Date Taking? Authorizing Provider  SUMAtriptan (IMITREX) 50 MG tablet Take 1 tablet (50 mg total) by mouth every 2 (two) hours as needed for migraine. May repeat in 2 hours if headache persists or recurs. 12/01/20  Yes Jamelyn Bovard, Elita Boone, NP  cetirizine (ZYRTEC ALLERGY) 10 MG tablet Take 1 tablet (10 mg total) by mouth daily. Patient taking differently: Take 10 mg by mouth daily as needed for allergies. 10/17/19   Wallis Bamberg, PA-C  erythromycin ophthalmic ointment Place a 1/2 inch ribbon of ointment into the lower eyelid. Patient not taking: Reported on 12/01/2020 11/17/20   Moshe Cipro, NP  promethazine-dextromethorphan (PROMETHAZINE-DM) 6.25-15 MG/5ML syrup Take 5 mLs by mouth 2 (two) times daily as needed for cough. Patient not taking: Reported on 12/01/2020 11/21/20   Raspet, Noberto Retort, PA-C    Family History Family History  Problem Relation Age of Onset   Hypertension Mother    Diabetes Mother    Hypertension Father    Diabetes Father    Heart Problems Father    Healthy Mother     Social History Social History   Tobacco Use   Smoking status: Never   Smokeless tobacco: Never  Vaping Use   Vaping Use: Every day  Substance Use Topics  Alcohol use: Yes   Drug use: Never     Allergies   Patient has no known allergies.   Review of Systems Review of Systems Defer to HPI   Physical Exam Triage Vital Signs ED Triage Vitals [12/01/20 1449]  Enc Vitals Group     BP 122/61     Pulse Rate 95     Resp 20     Temp 98.9 F (37.2 C)     Temp Source Oral     SpO2 96 %     Weight      Height      Head Circumference      Peak Flow      Pain Score      Pain Loc      Pain Edu?      Excl. in GC?    No data found.  Updated Vital Signs BP  122/61 (BP Location: Left Arm)   Pulse 95   Temp 98.9 F (37.2 C) (Oral)   Resp 20   SpO2 96%   Visual Acuity Right Eye Distance:   Left Eye Distance:   Bilateral Distance:    Right Eye Near:   Left Eye Near:    Bilateral Near:     Physical Exam Constitutional:      Appearance: He is well-developed. He is obese.  HENT:     Head: Normocephalic.  Eyes:     Extraocular Movements: Extraocular movements intact.  Cardiovascular:     Rate and Rhythm: Normal rate and regular rhythm.     Pulses: Normal pulses.     Heart sounds: Normal heart sounds.  Pulmonary:     Effort: Pulmonary effort is normal.     Breath sounds: Normal breath sounds.  Skin:    General: Skin is warm and dry.  Neurological:     Mental Status: He is alert and oriented to person, place, and time. Mental status is at baseline.  Psychiatric:        Mood and Affect: Mood normal.        Behavior: Behavior normal.     UC Treatments / Results  Labs (all labs ordered are listed, but only abnormal results are displayed) Labs Reviewed  SARS CORONAVIRUS 2 (TAT 6-24 HRS)  COMPREHENSIVE METABOLIC PANEL  CBC  TSH    EKG   Radiology No results found.  Procedures Procedures (including critical care time)  Medications Ordered in UC Medications - No data to display  Initial Impression / Assessment and Plan / UC Course  I have reviewed the triage vital signs and the nursing notes.  Pertinent labs & imaging results that were available during my care of the patient were reviewed by me and considered in my medical decision making (see chart for details).  Migraine without aura Lightheadedness  Sumatriptan 50 mg every 2 hours prn CMP, CBC, TSH pending  Covid test pending, needed to return to work  Discussed sleep hygiene and proper nutrition  Final Clinical Impressions(s) / UC Diagnoses   Final diagnoses:  Migraine without aura and without status migrainosus, not intractable  Lightheadedness      Discharge Instructions      Follow instructions of bottle for use of headache medication   Covid test pending 24 hours, you will be called if positive   Blood work  to check electrolytes, kidney, liver, thyroid and blood cells pending, you will be called if concerning   PCP referral will reach out to help you find a doctor  At any point if you pass out, lightheadedness worsens please go to nearest emergency department    ED Prescriptions     Medication Sig Dispense Auth. Provider   SUMAtriptan (IMITREX) 50 MG tablet Take 1 tablet (50 mg total) by mouth every 2 (two) hours as needed for migraine. May repeat in 2 hours if headache persists or recurs. 10 tablet Valinda Hoar, NP      PDMP not reviewed this encounter.   Valinda Hoar, NP 12/01/20 [phone]

## 2020-12-02 LAB — SARS CORONAVIRUS 2 (TAT 6-24 HRS): SARS Coronavirus 2: NEGATIVE

## 2021-02-27 ENCOUNTER — Emergency Department: Admit: 2021-02-27 | Payer: Self-pay

## 2021-02-27 ENCOUNTER — Ambulatory Visit (HOSPITAL_COMMUNITY): Payer: Self-pay

## 2021-02-28 ENCOUNTER — Other Ambulatory Visit: Payer: Self-pay

## 2021-02-28 ENCOUNTER — Emergency Department (INDEPENDENT_AMBULATORY_CARE_PROVIDER_SITE_OTHER)
Admission: EM | Admit: 2021-02-28 | Discharge: 2021-02-28 | Disposition: A | Discharge status: Home / Self Care | Payer: Self-pay | Source: Home / Self Care

## 2021-02-28 ENCOUNTER — Ambulatory Visit: Payer: Self-pay

## 2021-02-28 ENCOUNTER — Encounter: Payer: Self-pay | Admitting: Emergency Medicine

## 2021-02-28 DIAGNOSIS — R110 Nausea: Secondary | ICD-10-CM

## 2021-02-28 HISTORY — DX: COVID-19: U07.1

## 2021-02-28 HISTORY — DX: Concussion with loss of consciousness status unknown, initial encounter: S06.0XAA

## 2021-02-28 MED ORDER — ONDANSETRON 8 MG PO TBDP: 8.0000 mg | ORAL_TABLET | Freq: Three times a day (TID) | ORAL | 0 refills | Status: DC | PRN

## 2021-02-28 MED ORDER — ONDANSETRON 4 MG PO TBDP
4.0000 mg | ORAL_TABLET | Freq: Once | ORAL | Status: AC
  Administered 2021-02-28: 4 mg via ORAL

## 2021-02-28 NOTE — ED Triage Notes (Signed)
Nausea in triage  Emesis at work on Sunday  Nausea since Friday  OTC pepto bismol  Vicks Vapor Pods in the shower for mild congestion  No COVID vaccine  COVID x 2 in 2021

## 2021-02-28 NOTE — ED Provider Notes (Signed)
[facility]    CSN: [id number] Arrival date & time: 02/28/21  1603      History   Chief Complaint Chief Complaint  Patient presents with   Nausea    HPI Dontai Pember Muller-Reyes is a 20 y.o. male.   HPI 20 year old male presents with intermittent nausea for 4 days nausea. Reports 1 bout of emesis on Sunday.  Patient reports nausea began last week then resolved and now has returned.  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   Depression    Depression with anxiety 03/04/2017   H/O sexually transmitted disease 08/23/2016   Abdominal pain 06/25/2014   Oppositional defiant behavior 06/18/2011   Tinea corporis 10/02/2010   PREMATURE BIRTH 08/29/2009   VISUAL ACUITY, DECREASED 09/30/2007   Attention deficit hyperactivity disorder (ADHD) 07/25/2006    Past Surgical History:  Procedure Laterality Date   ORCHIOPEXY  05/09/2012   Procedure: ORCHIOPEXY PEDIATRIC;  Surgeon: Valetta Fuller, MD;  Location: [facility] OR;  Service: Urology;  Laterality: Bilateral;   SURGERY SCROTAL / TESTICULAR     TESTICLE TORSION REDUCTION  05/09/2012   Procedure: TESTICULAR TORSION REPAIR;  Surgeon: Valetta Fuller, MD;  Location: [facility] OR;  Service: Urology;  Laterality: N/A;       Home Medications    Prior to Admission medications   Medication Sig Start Date End Date Taking? Authorizing Provider  ondansetron (ZOFRAN ODT) 8 MG disintegrating tablet Take 1 tablet (8 mg total) by mouth  every 8 (eight) hours as needed for nausea or vomiting. 02/28/21  Yes Trevor Iha, FNP  cetirizine (ZYRTEC ALLERGY) 10 MG tablet Take 1 tablet (10 mg total) by mouth daily. Patient taking differently: Take 10 mg by mouth daily as needed for allergies. 10/17/19   Wallis Bamberg, PA-C  erythromycin ophthalmic ointment Place a 1/2 inch ribbon of ointment into the lower eyelid. Patient not taking: Reported on 12/01/2020 11/17/20   Moshe Cipro, NP  promethazine-dextromethorphan (PROMETHAZINE-DM) 6.25-15 MG/5ML syrup Take 5 mLs by mouth 2 (two) times daily as needed for cough. Patient not taking: Reported on 12/01/2020 11/21/20   Raspet, Noberto Retort, PA-C  SUMAtriptan (IMITREX) 50 MG tablet Take 1 tablet (50 mg total) by mouth every 2 (two) hours as needed for migraine. May repeat in 2 hours if headache persists or recurs. 12/01/20   Valinda Hoar, NP    Family History Family History  Problem Relation Age of Onset   Hypertension Mother    Diabetes Mother    Hypertension Father    Diabetes Father    Heart Problems Father    Healthy Mother     Social History Social History   Tobacco Use   Smoking status: Never   Smokeless tobacco: Never  Vaping Use   Vaping Use: Every day  Substance Use Topics   Alcohol use: Yes   Drug use: Never     Allergies   Patient  has no known allergies.   Review of Systems Review of Systems  Gastrointestinal:  Positive for nausea.  All other systems reviewed and are negative.   Physical Exam Triage Vital Signs ED Triage Vitals  Enc Vitals Group     BP      Pulse      Resp      Temp      Temp src      SpO2      Weight      Height      Head Circumference      Peak Flow      Pain Score      Pain Loc      Pain Edu?      Excl. in GC?    No data found.  Updated Vital Signs BP 108/72 (BP Location: Right Arm)   Pulse 75   Temp 98.8 F (37.1 C) (Oral)   Resp 16   Wt 231 lb 7.7 oz (105 kg)   SpO2 99%   BMI 32.29 kg/m      Physical  Exam Vitals and nursing note reviewed.  Constitutional:      General: He is not in acute distress.    Appearance: Normal appearance. He is normal weight. He is not ill-appearing.  HENT:     Head: Normocephalic and atraumatic.     Right Ear: Tympanic membrane, ear canal and external ear normal.     Left Ear: Tympanic membrane, ear canal and external ear normal.     Mouth/Throat:     Mouth: Mucous membranes are moist.     Pharynx: Oropharynx is clear.  Eyes:     Extraocular Movements: Extraocular movements intact.     Conjunctiva/sclera: Conjunctivae normal.     Pupils: Pupils are equal, round, and reactive to light.  Cardiovascular:     Rate and Rhythm: Normal rate and regular rhythm.     Pulses: Normal pulses.     Heart sounds: Normal heart sounds.  Pulmonary:     Effort: Pulmonary effort is normal.     Breath sounds: Normal breath sounds. No wheezing, rhonchi or rales.  Musculoskeletal:        General: Normal range of motion.     Cervical back: Normal range of motion and neck supple. No tenderness.  Lymphadenopathy:     Cervical: No cervical adenopathy.  Skin:    General: Skin is warm and dry.  Neurological:     General: No focal deficit present.     Mental Status: He is alert and oriented to person, place, and time. Mental status is at baseline.  Psychiatric:        Mood and Affect: Mood normal.        Behavior: Behavior normal.     UC Treatments / Results  Labs (all labs ordered are listed, but only abnormal results are displayed) Labs Reviewed - No data to display  EKG   Radiology No results found.  Procedures Procedures (including critical care time)  Medications Ordered in UC Medications  ondansetron (ZOFRAN-ODT) disintegrating tablet 4 mg (4 mg Oral Given 02/28/21 1622)  ondansetron (ZOFRAN-ODT) disintegrating tablet 4 mg (4 mg Oral Given 02/28/21 1634)    Initial Impression / Assessment and Plan / UC Course  I have reviewed the triage vital signs and  the nursing notes.  Pertinent labs & imaging results that were available during my care of the patient were reviewed by me and considered in my medical decision making (  see chart for details).     MDM: 1. Nausea-8 mg Zofran ODT given once in clinic prior to discharge, Rx'd Zofran. Advised patient to adhere to bland/brat diet for the next 2 to 3 days gradually returning to normal diet.  Encouraged patient to increase daily fluid intake either water, Gatorade G2, and/or ginger ale.  Work note provided prior to discharge.  Patient discharged home, hemodynamically stable. Final Clinical Impressions(s) / UC Diagnoses   Final diagnoses:  Nausea     Discharge Instructions      Advised patient to adhere to bland/brat diet for the next 2 to 3 days gradually returning to normal diet.  Encouraged patient to increase daily fluid intake either water, Gatorade G2, and/or ginger ale.  Work note provided prior to discharge.     ED Prescriptions     Medication Sig Dispense Auth. Provider   ondansetron (ZOFRAN ODT) 8 MG disintegrating tablet Take 1 tablet (8 mg total) by mouth every 8 (eight) hours as needed for nausea or vomiting. 24 tablet Trevor Iha, FNP      PDMP not reviewed this encounter.   Trevor Iha, FNP 02/28/21 1651

## 2021-02-28 NOTE — Discharge Instructions (Addendum)
Advised patient to adhere to bland/brat diet for the next 2 to 3 days gradually returning to normal diet.  Encouraged patient to increase daily fluid intake either water, Gatorade G2, and/or ginger ale.  Work note provided prior to discharge.

## 2021-03-14 ENCOUNTER — Ambulatory Visit (HOSPITAL_COMMUNITY): Payer: Self-pay

## 2021-03-23 ENCOUNTER — Other Ambulatory Visit: Payer: Self-pay

## 2021-03-23 ENCOUNTER — Other Ambulatory Visit (HOSPITAL_COMMUNITY)
Admission: RE | Admit: 2021-03-23 | Discharge: 2021-03-23 | Disposition: A | Discharge status: Home / Self Care | Payer: Self-pay | Source: Ambulatory Visit | Attending: Family Medicine | Admitting: Family Medicine

## 2021-03-23 ENCOUNTER — Emergency Department (INDEPENDENT_AMBULATORY_CARE_PROVIDER_SITE_OTHER)
Admission: RE | Admit: 2021-03-23 | Discharge: 2021-03-23 | Disposition: A | Discharge status: Home / Self Care | Payer: Self-pay | Source: Ambulatory Visit

## 2021-03-23 VITALS — BP 143/77 | HR 90 | Temp 98.6°F | Resp 20 | Ht 71.0 in | Wt 240.0 lb

## 2021-03-23 DIAGNOSIS — Z113 Encounter for screening for infections with a predominantly sexual mode of transmission: Secondary | ICD-10-CM | POA: Insufficient documentation

## 2021-03-23 DIAGNOSIS — Z202 Contact with and (suspected) exposure to infections with a predominantly sexual mode of transmission: Secondary | ICD-10-CM

## 2021-03-23 NOTE — ED Provider Notes (Signed)
[facility]    CSN: [id number] Arrival date & time: 03/23/21  1554      History   Chief Complaint Chief Complaint  Patient presents with   STI testing    HPI Ariv Penrod is a 20 y.o. male.   HPI 20 year old male presents for STD testing.  Reports his partner tested positive for trichomonas today.  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   Depression    Depression with anxiety 03/04/2017   H/O sexually transmitted disease 08/23/2016   Abdominal pain 06/25/2014   Oppositional defiant behavior 06/18/2011   Tinea corporis 10/02/2010   PREMATURE BIRTH 08/29/2009   VISUAL ACUITY, DECREASED 09/30/2007   Attention deficit hyperactivity disorder (ADHD) 07/25/2006    Past Surgical History:  Procedure Laterality Date   ORCHIOPEXY  05/09/2012   Procedure: ORCHIOPEXY PEDIATRIC;  Surgeon: Valetta Fuller, MD;  Location: [facility] OR;  Service: Urology;  Laterality: Bilateral;   SURGERY SCROTAL / TESTICULAR     TESTICLE TORSION REDUCTION  05/09/2012   Procedure: TESTICULAR TORSION REPAIR;  Surgeon: Valetta Fuller, MD;  Location: [facility] OR;  Service: Urology;  Laterality: N/A;       Home Medications    Prior to Admission medications   Medication Sig Start Date End Date Taking? Authorizing Provider  cetirizine (ZYRTEC ALLERGY) 10 MG tablet Take 1 tablet (10 mg total) by mouth daily. Patient taking differently: Take 10 mg by mouth daily as needed for  allergies. 10/17/19   Wallis Bamberg, PA-C  erythromycin ophthalmic ointment Place a 1/2 inch ribbon of ointment into the lower eyelid. Patient not taking: No sig reported 11/17/20   Moshe Cipro, NP  ondansetron (ZOFRAN ODT) 8 MG disintegrating tablet Take 1 tablet (8 mg total) by mouth every 8 (eight) hours as needed for nausea or vomiting. 02/28/21   Trevor Iha, FNP  promethazine-dextromethorphan (PROMETHAZINE-DM) 6.25-15 MG/5ML syrup Take 5 mLs by mouth 2 (two) times daily as needed for cough. Patient not taking: No sig reported 11/21/20   Raspet, Erin K, PA-C  SUMAtriptan (IMITREX) 50 MG tablet Take 1 tablet (50 mg total) by mouth every 2 (two) hours as needed for migraine. May repeat in 2 hours if headache persists or recurs. 12/01/20   Valinda Hoar, NP    Family History Family History  Problem Relation Age of Onset   Hypertension Mother    Diabetes Mother    Hypertension Father    Diabetes Father    Heart Problems Father     Social History Social History   Tobacco Use   Smoking status: Never   Smokeless tobacco: Never  Vaping Use   Vaping Use: Every day  Substance Use Topics   Alcohol use: Yes    Comment: rarely   Drug use: Never     Allergies   Patient has no known allergies.   Review of Systems Review of Systems  Constitutional:  Patient request STD testing and denies any current symptoms.  All other systems reviewed and are negative.   Physical Exam Triage Vital Signs ED Triage Vitals  Enc Vitals Group     BP 03/23/21 1615 (!) 143/77     Pulse Rate 03/23/21 1615 90     Resp 03/23/21 1615 20     Temp 03/23/21 1615 98.6 F (37 C)     Temp Source 03/23/21 1615 Oral     SpO2 03/23/21 1615 99 %     Weight 03/23/21 1612 240 lb (108.9 kg)     Height 03/23/21 1612 5\' 11"  (1.803 m)     Head Circumference --      Peak Flow --      Pain Score 03/23/21 1611 0     Pain Loc --      Pain Edu? --      Excl. in GC? --    No data found.  Updated  Vital Signs BP (!) 143/77 (BP Location: Right Arm)   Pulse 90   Temp 98.6 F (37 C) (Oral)   Resp 20   Ht 5\' 11"  (1.803 m)   Wt 240 lb (108.9 kg)   SpO2 99%   BMI 33.47 kg/m   Physical Exam Vitals and nursing note reviewed.  Constitutional:      Appearance: Normal appearance. He is obese.  HENT:     Head: Normocephalic and atraumatic.     Mouth/Throat:     Mouth: Mucous membranes are moist.     Pharynx: Oropharynx is clear.  Eyes:     Extraocular Movements: Extraocular movements intact.     Conjunctiva/sclera: Conjunctivae normal.     Pupils: Pupils are equal, round, and reactive to light.  Cardiovascular:     Rate and Rhythm: Normal rate and regular rhythm.     Pulses: Normal pulses.     Heart sounds: Normal heart sounds.  Pulmonary:     Effort: Pulmonary effort is normal.     Breath sounds: Normal breath sounds.  Musculoskeletal:        General: Normal range of motion.     Cervical back: Normal range of motion and neck supple.  Skin:    General: Skin is warm and dry.  Neurological:     General: No focal deficit present.     Mental Status: He is alert and oriented to person, place, and time. Mental status is at baseline.     UC Treatments / Results  Labs (all labs ordered are listed, but only abnormal results are displayed) Labs Reviewed  HIV ANTIBODY (ROUTINE TESTING W REFLEX)  RPR  HEPATITIS C ANTIBODY  CERVICOVAGINAL ANCILLARY ONLY    EKG   Radiology No results found.  Procedures Procedures (including critical care time)  Medications Ordered in UC Medications - No data to display  Initial Impression / Assessment and Plan / UC Course  I have reviewed the triage vital signs and the nursing notes.  Pertinent labs & imaging results that were available during my care of the patient were reviewed by me and considered in my medical decision making (see chart for details).     MDM: 1.  Potential exposure to STD-Aptima swab- trichomonas, GC  chlamydia; serological testing-HIV reflexes 1 and 2, RPR, hep C.  Advised/encouraged patient we will follow-up with lab results once received. Patient discharged home, hemodynamically stable. Final Clinical Impressions(s) / UC Diagnoses   Final diagnoses:  Potential exposure to STD     Discharge  Instructions      Advised/encouraged patient we will follow-up with lab results once received.     ED Prescriptions   None    PDMP not reviewed this encounter.   Trevor Iha, FNP 03/23/21 1709

## 2021-03-23 NOTE — Discharge Instructions (Addendum)
Advised/encouraged patient we will follow-up with lab results once received. 

## 2021-03-23 NOTE — ED Triage Notes (Signed)
Pt presents to Urgent Care for STI testing d/t partner testing positive for trichomoniasis today. Denies s/s.

## 2021-03-24 LAB — HIV ANTIBODY (ROUTINE TESTING W REFLEX): HIV 1&2 Ab, 4th Generation: NONREACTIVE

## 2021-03-24 LAB — HEPATITIS C ANTIBODY
Hepatitis C Ab: NONREACTIVE
SIGNAL TO CUT-OFF: 0.05 (ref <1.00)

## 2021-03-24 LAB — RPR: RPR Ser Ql: NONREACTIVE

## 2021-03-27 ENCOUNTER — Telehealth (HOSPITAL_COMMUNITY): Payer: Self-pay | Admitting: Emergency Medicine

## 2021-03-27 LAB — CERVICOVAGINAL ANCILLARY ONLY
Chlamydia: POSITIVE — AB
Comment: NEGATIVE
Comment: NEGATIVE
Comment: NORMAL
Neisseria Gonorrhea: NEGATIVE
Trichomonas: POSITIVE — AB

## 2021-03-27 MED ORDER — DOXYCYCLINE HYCLATE 100 MG PO CAPS: 100.0000 mg | ORAL_CAPSULE | Freq: Two times a day (BID) | ORAL | 0 refills | Status: AC

## 2021-03-27 MED ORDER — METRONIDAZOLE 500 MG PO TABS: 2000.0000 mg | ORAL_TABLET | Freq: Once | ORAL | 0 refills | Status: AC

## 2021-04-20 ENCOUNTER — Ambulatory Visit (HOSPITAL_COMMUNITY)
Admission: EM | Admit: 2021-04-20 | Discharge: 2021-04-20 | Disposition: A | Discharge status: Home / Self Care | Payer: No Payment, Other | Attending: Psychiatry | Admitting: Psychiatry

## 2021-04-20 DIAGNOSIS — F32A Depression, unspecified: Secondary | ICD-10-CM | POA: Insufficient documentation

## 2021-04-20 DIAGNOSIS — R4587 Impulsiveness: Secondary | ICD-10-CM | POA: Insufficient documentation

## 2021-04-20 DIAGNOSIS — F4323 Adjustment disorder with mixed anxiety and depressed mood: Secondary | ICD-10-CM | POA: Insufficient documentation

## 2021-04-20 DIAGNOSIS — Z9152 Personal history of nonsuicidal self-harm: Secondary | ICD-10-CM | POA: Insufficient documentation

## 2021-04-20 DIAGNOSIS — R4588 Nonsuicidal self-harm: Secondary | ICD-10-CM | POA: Insufficient documentation

## 2021-04-20 DIAGNOSIS — S51812A Laceration without foreign body of left forearm, initial encounter: Secondary | ICD-10-CM | POA: Insufficient documentation

## 2021-04-20 DIAGNOSIS — W458XXA Other foreign body or object entering through skin, initial encounter: Secondary | ICD-10-CM | POA: Insufficient documentation

## 2021-04-20 DIAGNOSIS — R450 Nervousness: Secondary | ICD-10-CM | POA: Insufficient documentation

## 2021-04-20 DIAGNOSIS — Z638 Other specified problems related to primary support group: Secondary | ICD-10-CM | POA: Insufficient documentation
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
## 2021-04-20 NOTE — Discharge Summary (Signed)
Steven Rhodes to be D/C'd Home per NP order. Discussed with the patient and all questions fully answered. An After Visit Summary was printed and given to the patient. Patient escorted out and D/C home via private auto.  [name]  04/20/2021 8:27 AM

## 2021-04-20 NOTE — Discharge Instructions (Signed)
Take all medications as prescribed. Keep all follow-up appointments as scheduled.  Do not consume alcohol or use illegal drugs while on prescription medications. Report any adverse effects from your medications to your primary care provider promptly.  In the event of recurrent symptoms or worsening symptoms, call 911, a crisis hotline, or go to the nearest emergency department for evaluation.   
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
## 2021-04-22 ENCOUNTER — Telehealth (HOSPITAL_COMMUNITY): Payer: Self-pay

## 2021-04-22 NOTE — BH Assessment (Signed)
Care Management - Follow Up Discharges   Writer attempted to make contact with patient today and was unsuccessful.  Writer left a HIPPA compliant voice message.   Per chart review, patient reports that he will be able to follow up with a provider independently.

## 2021-05-02 ENCOUNTER — Emergency Department (HOSPITAL_COMMUNITY): Payer: Self-pay

## 2021-05-02 ENCOUNTER — Other Ambulatory Visit: Payer: Self-pay

## 2021-05-02 ENCOUNTER — Emergency Department (HOSPITAL_COMMUNITY)
Admission: EM | Admit: 2021-05-02 | Discharge: 2021-05-03 | Disposition: A | Discharge status: Home / Self Care | Payer: Self-pay | Attending: Emergency Medicine | Admitting: Emergency Medicine

## 2021-05-02 DIAGNOSIS — Z20822 Contact with and (suspected) exposure to covid-19: Secondary | ICD-10-CM | POA: Insufficient documentation

## 2021-05-02 DIAGNOSIS — J101 Influenza due to other identified influenza virus with other respiratory manifestations: Secondary | ICD-10-CM | POA: Insufficient documentation

## 2021-05-02 LAB — RESP PANEL BY RT-PCR (FLU A&B, COVID) ARPGX2
Influenza A by PCR: POSITIVE — AB
Influenza B by PCR: NEGATIVE
SARS Coronavirus 2 by RT PCR: NEGATIVE

## 2021-05-02 MED ORDER — ACETAMINOPHEN 500 MG PO TABS
1000.0000 mg | ORAL_TABLET | Freq: Once | ORAL | Status: AC
  Administered 2021-05-02: 1000 mg via ORAL
  Filled 2021-05-02: qty 2

## 2021-05-02 NOTE — ED Triage Notes (Signed)
Pt reported to ED for evaluation of generalized bodyaches, fever, nausea and vomiting and coughing x2 days.

## 2021-05-02 NOTE — ED Provider Notes (Signed)
Emergency Medicine Provider Triage Evaluation Note  Steven Rhodes [facility] , a 20 y.o. male  was evaluated in triage.  Pt complains of fever, myalgias, nausea, vomiting, and cough x2 days.  Patient is unvaccinated against COVID-19.  Admits to shortness of breath.  Review of Systems  Positive: Cough, N/V Negative: CP  Physical Exam  BP 115/82 (BP Location: Right Arm)   Pulse (!) 113   Temp (!) 103.2 F (39.6 C) (Oral)   Resp 18   Ht 5\' 10"  (1.778 m)   Wt 106.6 kg   SpO2 100%   BMI 33.72 kg/m  Gen:   Awake, no distress   Resp:  Normal effort  MSK:   Moves extremities without difficulty  Other:    Medical Decision Making  Medically screening exam initiated at 8:22 PM.  Appropriate orders placed.  Steven Rhodes was informed that the remainder of the evaluation will be completed by another provider, this initial triage assessment does not replace that evaluation, and the importance of remaining in the ED until their evaluation is complete.  COVID/influenza CXR  Tylenol given in triage   05/02/21 2023    2024, MD 05/02/21 2138

## 2021-05-03 NOTE — ED Provider Notes (Signed)
[facility] EMERGENCY DEPARTMENT Provider Note   CSN: [id number] Arrival date & time: 05/02/21  1926     History Chief Complaint  Patient presents with   Generalized Body Aches    Steven Rhodes is a 20 y.o. male.  The history is provided by the patient.  Influenza Presenting symptoms: cough, diarrhea, fatigue, fever, headache, myalgias, nausea, shortness of breath, sore throat and vomiting   Severity:  Moderate Onset quality:  Gradual Duration:  2 days Progression:  Worsening Chronicity:  New Relieved by:  Nothing Worsened by:  Nothing Associated symptoms: chills   Pt with flu like illness for 2 days He reports cough/congestion/HA/ST/vomiting/diarrhea/myalgias and fatigue No recent travel    Past Medical History:  Diagnosis Date   ADHD (attention deficit hyperactivity disorder)    Concussion    4-5 per pt   COVID-19    x 2 in 2021   Fracture, radius, distal 03/16/2011   FRACTURED TOOTH 08/29/2009   Qualifier: Diagnosis of  By: Sheffield Slider MD, [facility]     Premature birth    Testicular torsion     Patient Active Problem List   Diagnosis Date Noted   Intermittent explosive disorder in adult    Gastroenteritis 11/27/2017   Self-injurious behavior 05/10/2017   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   Depression    Depression with anxiety 03/04/2017   H/O sexually transmitted disease 08/23/2016   Abdominal pain 06/25/2014   Oppositional defiant behavior 06/18/2011   Tinea corporis 10/02/2010   PREMATURE BIRTH 08/29/2009   VISUAL ACUITY, DECREASED 09/30/2007   Attention deficit hyperactivity disorder (ADHD) 07/25/2006    Past Surgical History:  Procedure Laterality Date   ORCHIOPEXY  05/09/2012   Procedure: ORCHIOPEXY PEDIATRIC;  Surgeon: Valetta Fuller, MD;  Location: [facility] OR;  Service: Urology;  Laterality: Bilateral;   SURGERY SCROTAL / TESTICULAR     TESTICLE TORSION REDUCTION  05/09/2012    Procedure: TESTICULAR TORSION REPAIR;  Surgeon: Valetta Fuller, MD;  Location: [facility] OR;  Service: Urology;  Laterality: N/A;       Family History  Problem Relation Age of Onset   Hypertension Mother    Diabetes Mother    Hypertension Father    Diabetes Father    Heart Problems Father     Social History   Tobacco Use   Smoking status: Never   Smokeless tobacco: Never  Vaping Use   Vaping Use: Every day  Substance Use Topics   Alcohol use: Yes    Comment: rarely   Drug use: Never    Home Medications Prior to Admission medications   Medication Sig Start Date End Date Taking? Authorizing Provider  cetirizine (ZYRTEC ALLERGY) 10 MG tablet Take 1 tablet (10 mg total) by mouth daily. Patient taking differently: Take 10 mg by mouth daily as needed for allergies. 10/17/19   Wallis Bamberg, PA-C  ondansetron (ZOFRAN ODT) 8 MG disintegrating tablet Take 1 tablet (8 mg total) by mouth every 8 (eight) hours as needed for nausea or vomiting. 02/28/21   Trevor Iha, FNP  SUMAtriptan (IMITREX) 50 MG tablet Take 1 tablet (50 mg total) by mouth every 2 (two) hours as needed for migraine. May repeat in 2 hours if headache persists or recurs. 12/01/20   Valinda Hoar, NP    Allergies    Patient has no known allergies.  Review of Systems   Review of Systems  Constitutional:  Positive for chills, fatigue and fever.  HENT:  Positive for sore throat.   Respiratory:  Positive for cough and shortness of breath.   Cardiovascular:  Negative for chest pain.  Gastrointestinal:  Positive for diarrhea, nausea and vomiting.  Musculoskeletal:  Positive for myalgias.  Neurological:  Positive for headaches.  All other systems reviewed and are negative.  Physical Exam Updated Vital Signs BP 115/82 (BP Location: Right Arm)   Pulse (!) 113   Temp (!) 103.2 F (39.6 C) (Oral)   Resp 18   Ht 1.778 m (5\' 10" )   Wt 106.6 kg   SpO2 100%   BMI 33.72 kg/m   Physical Exam CONSTITUTIONAL: Well  developed/well nourished HEAD: Normocephalic/atraumatic EYES: EOMI/PERRL ENMT: Mucous membranes moist, uvula midline, mild erythema, no edema NECK: supple no meningeal signs SPINE/BACK:entire spine nontender CV: S1/S2 noted, no murmurs/rubs/gallops noted LUNGS: Lungs are clear to auscultation bilaterally, no apparent distress ABDOMEN: soft, nontender, no rebound or guarding, bowel sounds noted throughout abdomen GU:no cva tenderness NEURO: Pt is awake/alert/appropriate, moves all extremitiesx4.  No facial droop.   EXTREMITIES: pulses normal/equal, full ROM SKIN: warm, color normal PSYCH: no abnormalities of mood noted, alert and oriented to situation  ED Results / Procedures / Treatments   Labs (all labs ordered are listed, but only abnormal results are displayed) Labs Reviewed  RESP PANEL BY RT-PCR (FLU A&B, COVID) ARPGX2 - Abnormal; Notable for the following components:      Result Value   Influenza A by PCR POSITIVE (*)    All other components within normal limits    EKG None  Radiology DG Chest 1 View  Result Date: 05/02/2021 CLINICAL DATA:  Cough EXAM: CHEST  1 VIEW COMPARISON:  Chest x-ray 02/12/2020 FINDINGS: The heart and mediastinal contours are within normal limits. No focal consolidation. No pulmonary edema. No pleural effusion. No pneumothorax. No acute osseous abnormality. IMPRESSION: No active disease. Electronically Signed   By: 02/14/2020 M.D.   On: 05/02/2021 20:53    Procedures Procedures   Medications Ordered in ED Medications  acetaminophen (TYLENOL) tablet 1,000 mg (1,000 mg Oral Given 05/02/21 2024)    ED Course  I have reviewed the triage vital signs and the nursing notes.      MDM Rules/Calculators/A&P                           Pt presents with flu like illness He is well appearing CXR negative No hypoxia He is safe for d/c home BP 131/76   Pulse (!) 103   Temp (!) 103.2 F (39.6 C) (Oral)   Resp 16   Ht 1.778 m (5\' 10" )   Wt  106.6 kg   SpO2 99%   BMI 33.72 kg/m   Final Clinical Impression(s) / ED Diagnoses Final diagnoses:  Influenza A    Rx / DC Orders ED Discharge Orders     None        2025, MD 05/03/21 [phone]

## 2021-06-09 ENCOUNTER — Emergency Department (HOSPITAL_BASED_OUTPATIENT_CLINIC_OR_DEPARTMENT_OTHER)
Admission: EM | Admit: 2021-06-09 | Discharge: 2021-06-09 | Disposition: A | Discharge status: Home / Self Care | Payer: Self-pay | Attending: Emergency Medicine | Admitting: Emergency Medicine

## 2021-06-09 ENCOUNTER — Encounter (HOSPITAL_BASED_OUTPATIENT_CLINIC_OR_DEPARTMENT_OTHER): Payer: Self-pay | Admitting: Emergency Medicine

## 2021-06-09 ENCOUNTER — Emergency Department (HOSPITAL_BASED_OUTPATIENT_CLINIC_OR_DEPARTMENT_OTHER): Payer: Self-pay | Admitting: Radiology

## 2021-06-09 DIAGNOSIS — M79641 Pain in right hand: Secondary | ICD-10-CM | POA: Insufficient documentation

## 2021-06-09 NOTE — ED Provider Notes (Signed)
[facility] EMERGENCY DEPT Provider Note   CSN: [id number] Arrival date & time: 06/09/21  1741     History  Chief Complaint  Patient presents with   Hand Pain    Steven Rhodes is a 21 y.o. male.  Patient presents ER chief complaint of right hand pain.  Onset was today.  Is lifting up some wood when he started to feel pain in the ulnar aspect at the base of his right hand.  Denies any other crush injury.  Denies fevers or fall cough or vomiting or diarrhea.      Home Medications Prior to Admission medications   Medication Sig Start Date End Date Taking? Authorizing Provider  cetirizine (ZYRTEC ALLERGY) 10 MG tablet Take 1 tablet (10 mg total) by mouth daily. Patient taking differently: Take 10 mg by mouth daily as needed for allergies. 10/17/19   Wallis Bamberg, PA-C  ondansetron (ZOFRAN ODT) 8 MG disintegrating tablet Take 1 tablet (8 mg total) by mouth every 8 (eight) hours as needed for nausea or vomiting. 02/28/21   Trevor Iha, FNP  SUMAtriptan (IMITREX) 50 MG tablet Take 1 tablet (50 mg total) by mouth every 2 (two) hours as needed for migraine. May repeat in 2 hours if headache persists or recurs. 12/01/20   Valinda Hoar, NP      Allergies    Patient has no known allergies.    Review of Systems   Review of Systems  Constitutional:  Negative for fever.  HENT:  Negative for ear pain and sore throat.   Eyes:  Negative for pain.  Respiratory:  Negative for cough.   Cardiovascular:  Negative for chest pain.  Gastrointestinal:  Negative for abdominal pain.  Genitourinary:  Negative for flank pain.  Musculoskeletal:  Negative for back pain.  Skin:  Negative for color change and rash.  Neurological:  Negative for syncope.  All other systems reviewed and are negative.  Physical Exam Updated Vital Signs BP 133/86 (BP Location: Left Arm)    Pulse 73    Temp 97.9 F (36.6 C)    Resp 14    Ht 5\' 11"  (1.803 m)    Wt 105.7 kg    SpO2 99%    BMI  32.50 kg/m  Physical Exam Constitutional:      Appearance: He is well-developed.  HENT:     Head: Normocephalic.     Nose: Nose normal.  Eyes:     Extraocular Movements: Extraocular movements intact.  Cardiovascular:     Rate and Rhythm: Normal rate.  Pulmonary:     Effort: Pulmonary effort is normal.  Musculoskeletal:     Comments: No gross deformity of the right hand noted.  Patient moderately tender at right hand wrist ulnar aspect on the volar surface.  Otherwise intact radial ulnar proximal pulses.  Radial ulnar median nerve all intact distally as well.  Compartments are soft no laceration abrasion or bruise noted.  Skin:    Coloration: Skin is not jaundiced.  Neurological:     Mental Status: He is alert. Mental status is at baseline.    ED Results / Procedures / Treatments   Labs (all labs ordered are listed, but only abnormal results are displayed) Labs Reviewed - No data to display  EKG None  Radiology DG Hand Complete Right  Result Date: 06/09/2021 CLINICAL DATA:  Pain and tingling EXAM: RIGHT HAND - COMPLETE 3+ VIEW COMPARISON:  None. FINDINGS: No fracture or dislocation is seen. Soft tissues are unremarkable.  Are IMPRESSION: No radiographic abnormality is seen in the right hand. Electronically Signed   By: Ernie Avena M.D.   On: 06/09/2021 18:43    Procedures Procedures    Medications Ordered in ED Medications - No data to display  ED Course/ Medical Decision Making/ A&P                           Medical Decision Making  X-ray of the hand is unremarkable.  I suspect soft tissue injury without any serious emergent condition.  Recommending outpatient follow-up with his primary care doctor within the week.  Recommending Tylenol Motrin as needed.  Patient declined pain medication while here in the ER.  Advised immediate return for fevers redness worsening pain otherwise follow-up with primary care doctor within the week.        Final Clinical  Impression(s) / ED Diagnoses Final diagnoses:  Pain of right hand    Rx / DC Orders ED Discharge Orders     None         Cheryll Cockayne, MD 06/09/21 2137

## 2021-06-09 NOTE — ED Triage Notes (Signed)
Pt reports hand pain and tingling since this morning. Unsure if any injury but does a lot of lifting for work.

## 2021-07-05 ENCOUNTER — Emergency Department (HOSPITAL_BASED_OUTPATIENT_CLINIC_OR_DEPARTMENT_OTHER): Payer: Worker's Compensation | Admitting: Radiology

## 2021-07-05 ENCOUNTER — Other Ambulatory Visit: Payer: Self-pay

## 2021-07-05 ENCOUNTER — Encounter (HOSPITAL_BASED_OUTPATIENT_CLINIC_OR_DEPARTMENT_OTHER): Payer: Self-pay

## 2021-07-05 ENCOUNTER — Emergency Department (HOSPITAL_BASED_OUTPATIENT_CLINIC_OR_DEPARTMENT_OTHER)
Admission: EM | Admit: 2021-07-05 | Discharge: 2021-07-05 | Disposition: A | Discharge status: Home / Self Care | Payer: Worker's Compensation | Attending: Emergency Medicine | Admitting: Emergency Medicine

## 2021-07-05 DIAGNOSIS — S8012XA Contusion of left lower leg, initial encounter: Secondary | ICD-10-CM | POA: Diagnosis not present

## 2021-07-05 DIAGNOSIS — W010XXA Fall on same level from slipping, tripping and stumbling without subsequent striking against object, initial encounter: Secondary | ICD-10-CM | POA: Diagnosis not present

## 2021-07-05 DIAGNOSIS — S8992XA Unspecified injury of left lower leg, initial encounter: Secondary | ICD-10-CM | POA: Diagnosis present

## 2021-07-05 DIAGNOSIS — Y990 Civilian activity done for income or pay: Secondary | ICD-10-CM | POA: Insufficient documentation

## 2021-07-05 MED ORDER — IBUPROFEN 600 MG PO TABS: 600.0000 mg | ORAL_TABLET | Freq: Four times a day (QID) | ORAL | 0 refills | Status: DC | PRN

## 2021-07-05 NOTE — ED Triage Notes (Signed)
Patient here POV from Home with Leg Injury.  Patient states he fell approximately 4 days PTA at work. ON-Call Medical Staff stated to Ice Injury. Pain with Movement. Bruising to Same. No Deformity noted.  NAD Noted during Triage. A&Ox4. GCS 15. Ambulatory.

## 2021-07-05 NOTE — ED Provider Notes (Signed)
[facility] EMERGENCY DEPT Provider Note   CSN: [id number] Arrival date & time: 07/05/21  1213     History  Chief Complaint  Patient presents with   Leg Injury    Steven Rhodes is a 21 y.o. male.  The history is provided by the patient. No language interpreter was used.   21 year old male who presents valuation of a previous fall.  Patient reported 4 days ago he was at work lifting up some hands.  States it was not heavy however he fell forward and felt pain to his left tib-fib region.  States he immediately noticed swelling to the affected area and was having trouble standing up or bearing weight.  Swelling did improve using eyes for the past few days but he still endorsed having nonradiating sharp pain to the affected area.  He denies having any hip pain or knee pain or ankle pain.  He denies any numbness weakness.  He denies any calf pain.  No chest pain or trouble breathing.  Home Medications Prior to Admission medications   Medication Sig Start Date End Date Taking? Authorizing Provider  cetirizine (ZYRTEC ALLERGY) 10 MG tablet Take 1 tablet (10 mg total) by mouth daily. Patient taking differently: Take 10 mg by mouth daily as needed for allergies. 10/17/19   Wallis Bamberg, PA-C  ondansetron (ZOFRAN ODT) 8 MG disintegrating tablet Take 1 tablet (8 mg total) by mouth every 8 (eight) hours as needed for nausea or vomiting. 02/28/21   Trevor Iha, FNP  SUMAtriptan (IMITREX) 50 MG tablet Take 1 tablet (50 mg total) by mouth every 2 (two) hours as needed for migraine. May repeat in 2 hours if headache persists or recurs. 12/01/20   Valinda Hoar, NP      Allergies    Patient has no known allergies.    Review of Systems   Review of Systems  Constitutional:  Negative for fever.  Musculoskeletal:  Positive for arthralgias.  Skin:  Negative for wound.   Physical Exam Updated Vital Signs BP (!) 133/92 (BP Location: Right Arm)    Pulse 76    Temp 98.5 F  (36.9 C) (Oral)    Resp 12    Ht 5\' 11"  (1.803 m)    Wt 105.7 kg    SpO2 98%    BMI 32.50 kg/m  Physical Exam Vitals and nursing note reviewed.  Constitutional:      General: He is not in acute distress.    Appearance: He is well-developed.  HENT:     Head: Atraumatic.  Eyes:     Conjunctiva/sclera: Conjunctivae normal.  Musculoskeletal:        General: Tenderness (Left lower extremity: Tenderness to distal anterior tib-fib with faint bruising noted medially.  No crepitus.  No tenderness to the calf no edema erythema or warmth.  Intact DP pulse) present.     Cervical back: Neck supple.     Comments: Left hip, knee, and ankle are nontender to palpation.  Able to ambulate  Skin:    Findings: No rash.  Neurological:     Mental Status: He is alert.    ED Results / Procedures / Treatments   Labs (all labs ordered are listed, but only abnormal results are displayed) Labs Reviewed - No data to display  EKG None  Radiology DG Tibia/Fibula Left  Result Date: 07/05/2021 CLINICAL DATA:  Fall, left leg pain EXAM: LEFT TIBIA AND FIBULA - 2 VIEW COMPARISON:  None. FINDINGS: There is no evidence of  fracture or other focal bone lesions. Soft tissues are unremarkable. IMPRESSION: Negative. Electronically Signed   By: Duanne Guess D.O.   On: 07/05/2021 15:42    Procedures Procedures    Medications Ordered in ED Medications - No data to display  ED Course/ Medical Decision Making/ A&P                           Medical Decision Making Amount and/or Complexity of Data Reviewed Radiology: ordered.   BP (!) 133/92 (BP Location: Right Arm)    Pulse 76    Temp 98.5 F (36.9 C) (Oral)    Resp 12    Ht 5\' 11"  (1.803 m)    Wt 105.7 kg    SpO2 98%    BMI 32.50 kg/m   3:16 PM Patient here with complaint of pain to left anterior tib-fib from a fall 4 days prior.  He does have some faint bruising noted to the affected area.  Will obtain x-ray to rule out fracture.  He does not have any  edema erythema or warmth to suggest infection.  He does not have any calf tenderness to suggest DVT.  He is able to flex and extend his ankle, have low suspicion for Achilles tendon rupture.  He has a soft compartment, doubt compartment syndrome.  He has intact distal pedal pulse.  4:22 PM X-ray of the left tibia/fibula was independently visualized and reviewed interpreted by me as negative for any acute fracture or foreign body.  I suspect this pain is likely contusion from trauma.  Patient is neurovascularly intact.  Will provide symptomatic treatment and will also provide orthopedic referral as needed.        Final Clinical Impression(s) / ED Diagnoses Final diagnoses:  Contusion of left leg, initial encounter    Rx / DC Orders ED Discharge Orders          Ordered    ibuprofen (ADVIL) 600 MG tablet  Every 6 hours PRN        07/05/21 1629              09/02/21, PA-C 07/05/21 1630    [place], 09/02/21, DO 07/06/21 [phone]

## 2021-07-29 ENCOUNTER — Ambulatory Visit (HOSPITAL_COMMUNITY)
Admission: RE | Admit: 2021-07-29 | Discharge: 2021-07-29 | Disposition: A | Discharge status: Home / Self Care | Payer: Self-pay | Attending: Psychiatry | Admitting: Psychiatry

## 2021-07-29 NOTE — H&P (Signed)
Behavioral Health Medical Screening Exam ? ?Steven Rhodes is an 21 y.o. male with psychiatric history of anxiety and depression.  Patient presented voluntarily to [facility] with complaint of increased stress and anxiety.  ? ?Patient was seen face to face and his chart was reviewed by this nurse practitioner. On evaluation, patient is alert and oriented x4; he is calm and cooperative. Patient's mood is anxious with congruent affect. Patient reports that he is experiencing worsening anxiety due to increased stress. Patient states "I feel overwhelmed. I have car and money issues and its affecting my mental health." He reports that he is "struggling financially" and recently has unexpected expenses that are causing him increased stress. He reports that he had to repair his car multiple times and also recently got 2 speeding tickets.  ? ?Patient reports that he has been working at [employer] for about 4 months and he has left work multiple times due to "anxiety and thinking a lot." Patient reports "I left work because I don't want to have a mental breakdown." He says his employer has placed him on disciplinary  action due to leaving work mid-shift.  ?He says he is worried about losing his job and would like FMLA (Family and medical leave act) form to be filled for him to prevent him from losing his job.  ?Patient says he does not have an outpatient mental health provider or therapist. He also does not have a primary care provider. ?  ?Patient denies suicidal ideation, homicidal ideation, and psychosis. He admits to history of self-harming by cutting. He last engaged in self-harming in Nov 2022. He denies substance abuse. He says that he vapes nicotine daily. He reports that he leaves at home with his parents. ? ?Total Time spent with patient: 20 minutes ? ?Psychiatric Specialty Exam: ?Physical Exam ?Constitutional:   ?   General: He is not in acute distress. ?   Appearance: He is not  ill-appearing, toxic-appearing or diaphoretic.  ?HENT:  ?   Head: Normocephalic.  ?Eyes:  ?   General:     ?   Right eye: No discharge.     ?   Left eye: No discharge.  ?   Conjunctiva/sclera: Conjunctivae normal.  ?Cardiovascular:  ?   Rate and Rhythm: Normal rate.  ?Pulmonary:  ?   Effort: Pulmonary effort is normal. No respiratory distress.  ?Musculoskeletal:     ?   General: Normal range of motion.  ?   Cervical back: Normal range of motion.  ?Skin: ?   Coloration: Skin is not jaundiced or pale.  ?   Findings: No bruising, erythema, lesion or rash.  ?Neurological:  ?   Mental Status: He is alert and oriented to person, place, and time.  ?Psychiatric:     ?   Attention and Perception: Attention and perception normal.     ?   Mood and Affect: Mood is anxious.     ?   Speech: Speech normal.     ?   Behavior: Behavior normal. Behavior is cooperative.     ?   Thought Content: Thought content normal.     ?   Cognition and Memory: Cognition normal.  ? ?Review of Systems  ?Constitutional: Negative.   ?HENT: Negative.    ?Eyes: Negative.   ?Respiratory: Negative.    ?Cardiovascular: Negative.   ?Gastrointestinal: Negative.   ?Endocrine: Negative.   ?Genitourinary: Negative.   ?Musculoskeletal: Negative.   ?Skin: Negative.   ?Allergic/Immunologic: Negative.   ?  Neurological: Negative.   ?Hematological: Negative.   ?Psychiatric/Behavioral:  The patient is nervous/anxious.   ?Blood pressure 120/66, pulse 84, temperature 99.2 ?F (37.3 ?C), temperature source Oral, resp. rate 14, SpO2 99 %.There is no height or weight on file to calculate BMI. ?General Appearance: Well Groomed ?Eye Contact:  Good ?Speech:  Clear and Coherent ?Volume:  Normal ?Mood:  Anxious ?Affect:  Congruent ?Thought Process:  Coherent ?Orientation:  Full (Time, Place, and Person) ?Thought Content:  WDL ?Suicidal Thoughts:  No ?Homicidal Thoughts:  No ?Memory:  Immediate;   Good ?Recent;   Good ?Remote;   Good ?Judgement:  Fair ?Insight:  Good ?Psychomotor  Activity:  Normal ?Concentration: Concentration: Good and Attention Span: Good ?Recall:  Good ?Fund of Knowledge:Good ?Language: Good ?Akathisia:  No ?Handed:  Right ?AIMS (if indicated):    ?Assets:  Communication Skills ?Desire for Improvement ?Housing ?Intimacy ?Physical Health ?Social Support ?Transportation ?Vocational/Educational ?Sleep:    ? ?Musculoskeletal: ?Strength & Muscle Tone: within normal limits ?Gait & Station: normal ?Patient leans: Right ? ?Blood pressure 120/66, pulse 84, temperature 99.2 ?F (37.3 ?C), temperature source Oral, resp. rate 14, SpO2 99 %. ? ?Recommendations: ?Based on my evaluation the patient does not appear to have an emergency medical condition. ?This Clinical research associate explained to patient that FLMA will have to be filled by his primary care provider or psychiatrist. Patient was provided with outpatient resources as well as information for Open Access at [facility]. Patient was also encouraged to reach out to his employer to inquire about Employee Assistance Program for counseling/therapy as well as other assistance his Employer may offer. Patient verbalized understanding and agreement with plan.  ? ?No evidence of imminent danger to self or others at this time. Patient does not meet criteria for psychiatric admission or IVC. Supportive therapy provided about ongoing stressors. Discussed crisis plan, callling [phone] or going to Emergency Dept ? ?Maricela Bo, NP ?07/29/2021, 11:52 PM ? ?

## 2021-08-10 ENCOUNTER — Emergency Department (HOSPITAL_BASED_OUTPATIENT_CLINIC_OR_DEPARTMENT_OTHER)
Admission: EM | Admit: 2021-08-10 | Discharge: 2021-08-10 | Disposition: A | Discharge status: Home / Self Care | Payer: Self-pay | Attending: Emergency Medicine | Admitting: Emergency Medicine

## 2021-08-10 ENCOUNTER — Other Ambulatory Visit: Payer: Self-pay

## 2021-08-10 ENCOUNTER — Emergency Department (HOSPITAL_BASED_OUTPATIENT_CLINIC_OR_DEPARTMENT_OTHER): Payer: Self-pay

## 2021-08-10 ENCOUNTER — Encounter (HOSPITAL_BASED_OUTPATIENT_CLINIC_OR_DEPARTMENT_OTHER): Payer: Self-pay | Admitting: Emergency Medicine

## 2021-08-10 DIAGNOSIS — B349 Viral infection, unspecified: Secondary | ICD-10-CM | POA: Insufficient documentation

## 2021-08-10 DIAGNOSIS — R1084 Generalized abdominal pain: Secondary | ICD-10-CM | POA: Insufficient documentation

## 2021-08-10 DIAGNOSIS — R000 Tachycardia, unspecified: Secondary | ICD-10-CM | POA: Insufficient documentation

## 2021-08-10 LAB — COMPREHENSIVE METABOLIC PANEL
ALT: 34 U/L (ref 0–44)
AST: 26 U/L (ref 15–41)
Albumin: 4.8 g/dL (ref 3.5–5.0)
Alkaline Phosphatase: 75 U/L (ref 38–126)
Anion gap: 10 (ref 5–15)
BUN: 20 mg/dL (ref 6–20)
CO2: 25 mmol/L (ref 22–32)
Calcium: 9.9 mg/dL (ref 8.9–10.3)
Chloride: 102 mmol/L (ref 98–111)
Creatinine, Ser: 0.85 mg/dL (ref 0.61–1.24)
GFR, Estimated: >60 mL/min (ref >60)
Glucose, Bld: 120 mg/dL — ABNORMAL HIGH (ref 70–99)
Potassium: 3.5 mmol/L (ref 3.5–5.1)
Sodium: 137 mmol/L (ref 135–145)
Total Bilirubin: 1 mg/dL (ref 0.3–1.2)
Total Protein: 8.5 g/dL — ABNORMAL HIGH (ref 6.5–8.1)

## 2021-08-10 LAB — CBC
HCT: 44.3 % (ref 39.0–52.0)
Hemoglobin: 15.1 g/dL (ref 13.0–17.0)
MCH: 28.4 pg (ref 26.0–34.0)
MCHC: 34.1 g/dL (ref 30.0–36.0)
MCV: 83.4 fL (ref 80.0–100.0)
Platelets: 270 10*3/uL (ref 150–400)
RBC: 5.31 MIL/uL (ref 4.22–5.81)
RDW: 13.1 % (ref 11.5–15.5)
WBC: 12.1 10*3/uL — ABNORMAL HIGH (ref 4.0–10.5)
nRBC: 0 % (ref 0.0–0.2)

## 2021-08-10 LAB — LACTIC ACID, PLASMA: Lactic Acid, Venous: 1.5 mmol/L (ref 0.5–1.9)

## 2021-08-10 LAB — LIPASE, BLOOD: Lipase: 18 U/L (ref 11–51)

## 2021-08-10 MED ORDER — ACETAMINOPHEN 500 MG PO TABS
1000.0000 mg | ORAL_TABLET | Freq: Once | ORAL | Status: AC
  Administered 2021-08-10: 1000 mg via ORAL
  Filled 2021-08-10: qty 2

## 2021-08-10 MED ORDER — ONDANSETRON HCL 4 MG/2ML IJ SOLN
INTRAMUSCULAR | Status: AC
Start: 2021-08-10 — End: 2021-08-10
  Administered 2021-08-10: 4 mg
  Filled 2021-08-10: qty 2

## 2021-08-10 MED ORDER — SODIUM CHLORIDE 0.9 % IV BOLUS
1000.0000 mL | Freq: Once | INTRAVENOUS | Status: AC
  Administered 2021-08-10: 1000 mL via INTRAVENOUS

## 2021-08-10 MED ORDER — ONDANSETRON HCL 4 MG PO TABS: 4.0000 mg | ORAL_TABLET | Freq: Four times a day (QID) | ORAL | 0 refills | Status: DC | PRN

## 2021-08-10 MED ORDER — ONDANSETRON 4 MG PO TBDP: 4.0000 mg | ORAL_TABLET | Freq: Once | ORAL | Status: DC | PRN

## 2021-08-10 MED ORDER — IOHEXOL 300 MG/ML  SOLN
100.0000 mL | Freq: Once | INTRAMUSCULAR | Status: AC | PRN
  Administered 2021-08-10: 100 mL via INTRAVENOUS

## 2021-08-10 NOTE — ED Triage Notes (Signed)
Pt presents from home for nausea/abd cramping x 2 days with vomiting, fever, chills starting today. Denies blood in stool or vomit. No OTC medications.  ?

## 2021-08-10 NOTE — ED Provider Notes (Signed)
?[facility] EMERGENCY DEPT ?Provider Note ? ? ?CSN: [id number] ?Arrival date & time: 08/10/21  0059 ? ?  ? ?History ? ?Chief Complaint  ?Patient presents with  ? Emesis  ? ? ?Steven Rhodes is a 21 y.o. male. ? ?Patient presents to the emergency department for evaluation of nausea, vomiting, chills, generalized weakness.  Symptoms have been present for 1-1/2 days.  Patient feels constipated, no diarrhea.  He has had a slight cough, no shortness of breath. ? ? ?  ? ?Home Medications ?Prior to Admission medications   ?Medication Sig Start Date End Date Taking? Authorizing Provider  ?ondansetron (ZOFRAN) 4 MG tablet Take 1 tablet (4 mg total) by mouth every 6 (six) hours as needed for nausea or vomiting. 08/10/21  Yes Vander Kueker, Canary Brim, MD  ?cetirizine (ZYRTEC ALLERGY) 10 MG tablet Take 1 tablet (10 mg total) by mouth daily. ?Patient taking differently: Take 10 mg by mouth daily as needed for allergies. 10/17/19   Wallis Bamberg, PA-C  ?ibuprofen (ADVIL) 600 MG tablet Take 1 tablet (600 mg total) by mouth every 6 (six) hours as needed. 07/05/21   Fayrene Helper, PA-C  ?SUMAtriptan (IMITREX) 50 MG tablet Take 1 tablet (50 mg total) by mouth every 2 (two) hours as needed for migraine. May repeat in 2 hours if headache persists or recurs. 12/01/20   Valinda Hoar, NP  ?   ? ?Allergies    ?Patient has no known allergies.   ? ?Review of Systems   ?Review of Systems  ?Constitutional:  Positive for chills.  ?Respiratory:  Positive for cough.   ?Gastrointestinal:  Positive for nausea and vomiting.  ? ?Physical Exam ?Updated Vital Signs ?BP 116/65   Pulse 90   Temp 100.1 ?F (37.8 ?C) (Oral)   Resp 18   Ht 5\' 11"  (1.803 m)   Wt 104.3 kg   SpO2 96%   BMI 32.08 kg/m?  ?Physical Exam ?Vitals and nursing note reviewed.  ?Constitutional:   ?   General: He is not in acute distress. ?   Appearance: He is well-developed.  ?HENT:  ?   Head: Normocephalic and atraumatic.  ?   Mouth/Throat:  ?   Mouth: Mucous  membranes are moist.  ?Eyes:  ?   General: Vision grossly intact. Gaze aligned appropriately.  ?   Extraocular Movements: Extraocular movements intact.  ?   Conjunctiva/sclera: Conjunctivae normal.  ?Cardiovascular:  ?   Rate and Rhythm: Regular rhythm. Tachycardia present.  ?   Pulses: Normal pulses.  ?   Heart sounds: Normal heart sounds, S1 normal and S2 normal. No murmur heard. ?  No friction rub. No gallop.  ?Pulmonary:  ?   Effort: Pulmonary effort is normal. No respiratory distress.  ?   Breath sounds: Normal breath sounds.  ?Abdominal:  ?   Palpations: Abdomen is soft.  ?   Tenderness: There is generalized abdominal tenderness. There is no guarding or rebound.  ?   Hernia: No hernia is present.  ?Musculoskeletal:     ?   General: No swelling.  ?   Cervical back: Full passive range of motion without pain, normal range of motion and neck supple. No pain with movement, spinous process tenderness or muscular tenderness. Normal range of motion.  ?   Right lower leg: No edema.  ?   Left lower leg: No edema.  ?Skin: ?   General: Skin is warm and dry.  ?   Capillary Refill: Capillary refill takes less than 2  seconds.  ?   Findings: No ecchymosis, erythema, lesion or wound.  ?Neurological:  ?   Mental Status: He is alert and oriented to person, place, and time.  ?   GCS: GCS eye subscore is 4. GCS verbal subscore is 5. GCS motor subscore is 6.  ?   Cranial Nerves: Cranial nerves 2-12 are intact.  ?   Sensory: Sensation is intact.  ?   Motor: Motor function is intact. No weakness or abnormal muscle tone.  ?   Coordination: Coordination is intact.  ?Psychiatric:     ?   Mood and Affect: Mood normal.     ?   Speech: Speech normal.     ?   Behavior: Behavior normal.  ? ? ?ED Results / Procedures / Treatments   ?Labs ?(all labs ordered are listed, but only abnormal results are displayed) ?Labs Reviewed  ?COMPREHENSIVE METABOLIC PANEL - Abnormal; Notable for the following components:  ?    Result Value  ? Glucose, Bld 120  (*)   ? Total Protein 8.5 (*)   ? All other components within normal limits  ?CBC - Abnormal; Notable for the following components:  ? WBC 12.1 (*)   ? All other components within normal limits  ?LIPASE, BLOOD  ?LACTIC ACID, PLASMA  ?URINALYSIS, ROUTINE W REFLEX MICROSCOPIC  ? ? ?EKG ?None ? ?Radiology ?CT ABDOMEN PELVIS W CONTRAST ? ?Result Date: 08/10/2021 ?CLINICAL DATA:  21 year old male with abdominal pain, nausea vomiting. EXAM: CT ABDOMEN AND PELVIS WITH CONTRAST TECHNIQUE: Multidetector CT imaging of the abdomen and pelvis was performed using the standard protocol following bolus administration of intravenous contrast. RADIATION DOSE REDUCTION: This exam was performed according to the departmental dose-optimization program which includes automated exposure control, adjustment of the mA and/or kV according to patient size and/or use of iterative reconstruction technique. CONTRAST:  OMNIPAQUE IOHEXOL 300 MG/ML  SOLN COMPARISON:  Noncontrast CT Abdomen and Pelvis 02/13/2020. FINDINGS: Lower chest: Negative; mild costophrenic angle atelectasis is less pronounced than in 09-27-2019. Hepatobiliary: Tiny benign cyst at the lateral liver dome on series 2, image 4 is stable since Sep 27, 2019. Otherwise negative liver and gallbladder. Pancreas: Negative. Spleen: Negative. Adrenals/Urinary Tract: Normal adrenal glands. Nonobstructed kidneys with symmetric renal enhancement. No nephrolithiasis or pararenal inflammation. Negative ureters and bladder. Stomach/Bowel: Stable and normal appendix series 2, image 45. mostly decompressed large and small bowel throughout the abdomen and pelvis. Stomach and duodenum appear negative. No free air, free fluid, mesenteric inflammation. Vascular/Lymphatic: Major arterial structures appear patent and normal. Patent portal venous system. No lymphadenopathy. Reproductive: Negative. Other: No pelvic free fluid. Musculoskeletal: Negative. IMPRESSION: Negative CT Abdomen and Pelvis. No acute or  inflammatory process identified. Electronically Signed   By: Odessa Fleming M.D.   On: 08/10/2021 05:44   ? ?Procedures ?Procedures  ? ? ?Medications Ordered in ED ?Medications  ?ondansetron (ZOFRAN-ODT) disintegrating tablet 4 mg (has no administration in time range)  ?acetaminophen (TYLENOL) tablet 1,000 mg (1,000 mg Oral Given 08/10/21 0253)  ?sodium chloride 0.9 % bolus 1,000 mL (0 mLs Intravenous Stopped 08/10/21 0347)  ?ondansetron (ZOFRAN) 4 MG/2ML injection (4 mg  Given 08/10/21 0236)  ?iohexol (OMNIPAQUE) 300 MG/ML solution 100 mL (100 mLs Intravenous Contrast Given 08/10/21 0503)  ? ? ?ED Course/ Medical Decision Making/ A&P ?  ?                        ?Medical Decision Making ?Amount and/or Complexity of Data Reviewed ?Labs:  ordered. ?Radiology: ordered. ? ?Risk ?OTC drugs. ?Prescription drug management. ? ? ?Presents to the emergency department for evaluation of nausea, vomiting, fever, chills.  Symptoms ongoing for 2 days.  Patient has not had associated diarrhea.  He complains of diffuse abdominal pain. ? ?Differential diagnosis considered includes viral gastroenteritis, gastritis, cholecystitis, appendicitis, COVID-19, influenza. ? ?Patient's evaluation has been unremarkable.  He had a low-grade fever and tachycardia at arrival.  This has resolved with fluids and antipyretics.  Vital signs are otherwise unremarkable.  Patient reported significant improvement with antiemetics but still had diffuse abdominal tenderness.  CT scan was therefore performed and no acute pathology is noted. ? ?Patient has been appropriately worked up and for safe for discharge.  Does not require hospitalization.  We will prescribe symptomatic treatment at discharge. ? ? ? ? ? ? ? ?Final Clinical Impression(s) / ED Diagnoses ?Final diagnoses:  ?Nausea and vomiting, unspecified vomiting type  ?Viral illness  ? ? ?Rx / DC Orders ?ED Discharge Orders   ? ?      Ordered  ?  ondansetron (ZOFRAN) 4 MG tablet  Every 6 hours PRN       ? 08/10/21  [id number]  ? ?  ?  ? ?  ? ? ?  ?Gilda CreasePollina, Girlie Veltri J, MD ?08/10/21 [phone] ? ?

## 2021-08-28 ENCOUNTER — Ambulatory Visit (HOSPITAL_COMMUNITY)
Admission: EM | Admit: 2021-08-28 | Discharge: 2021-08-28 | Disposition: A | Discharge status: Home / Self Care | Payer: No Payment, Other | Attending: Urology | Admitting: Urology

## 2021-08-28 DIAGNOSIS — R4588 Nonsuicidal self-harm: Secondary | ICD-10-CM | POA: Insufficient documentation

## 2021-08-28 DIAGNOSIS — Z9151 Personal history of suicidal behavior: Secondary | ICD-10-CM | POA: Insufficient documentation

## 2021-08-28 DIAGNOSIS — F411 Generalized anxiety disorder: Secondary | ICD-10-CM | POA: Insufficient documentation

## 2021-08-28 DIAGNOSIS — F331 Major depressive disorder, recurrent, moderate: Secondary | ICD-10-CM | POA: Insufficient documentation

## 2021-08-28 DIAGNOSIS — Z630 Problems in relationship with spouse or partner: Secondary | ICD-10-CM | POA: Insufficient documentation

## 2021-08-28 MED ORDER — SERTRALINE HCL 25 MG PO TABS: 25.0000 mg | ORAL_TABLET | Freq: Every day | ORAL | 2 refills | Status: DC

## 2021-08-28 MED ORDER — HYDROXYZINE HCL 25 MG PO TABS: 25.0000 mg | ORAL_TABLET | Freq: Three times a day (TID) | ORAL | 2 refills | Status: DC | PRN

## 2021-08-28 NOTE — ED Notes (Signed)
Pt discharged in no acute distress. A&O x4, ambulatory. Denied SI/HI/AVH. Verbalized understanding of AVS instructions reviewed by RN. NP provided prescriptions. Belongings returned to pt intact from orange locker. Pt escorted to front lobby by staff. Safety maintained.  ?

## 2021-08-28 NOTE — Progress Notes (Signed)
?   08/28/21 0410  ?[facility] Triage Screening (Walk-ins at [facility] only)  ?How Did You Hear About [place]? Self  ?What Is the Reason for Your Visit/Call Today? Pt has history of depression, anxiety, and "anger problems." He says tonight he needed to speak with someone because he feels "numb, angry, and anxiety." He says he feels like he wants to "explode or break down and cry." He states his girlfriend told him that she cheated on him but he suspects she is not telling the truth because she has lied to him in the past and "I am constantly checking on her." He says he has "major trust issues" and that he feels overwhelmed and "confused." He says he has a 23-year-old son by another woman and he has little contact with his son. He says he works in a [employer] and is having financial stress. He says, "The thoughts in my head make me want to scream" and he wanted to get some help "before I did something stupid." Pt clarifies he is not suicidal and has no plan or intent to harm anyone. He reports a history of two presvious suicide attempts, including attempting to hang himself at age 21 and cutting his forearm approximately two years ago. He states he was scratching his left forearm tonight because he was upset and anxious and he does have superficial scratches. He says he has been sleeping very little and eating one meal per day. He denies auditory or visual hallucinations. He denies recent alcohol or substance use, explaining he used to drink alcohol and use marijuana when he was younger. He lives with his mother, father, two sisters and three nephews. He says he is interested in counseling and possibly medication but is worried about the cost. Pt has been seen at [facility] and [facility] in the past.  ?How Long Has This Been Causing You Problems? 1 wk - 1 month  ?Have You Recently Had Any Thoughts About Hurting Yourself? No  ?Are You Planning to Commit Suicide/Harm Yourself At This time? No  ?Have you Recently Had Thoughts About Hurting  Someone Karolee Ohs? No  ?Are You Planning To Harm Someone At This Time? No  ?Are you currently experiencing any auditory, visual or other hallucinations? No  ?Have You Used Any Alcohol or Drugs in the Past 24 Hours? No  ?Do you have any current medical co-morbidities that require immediate attention? No  ?Clinician description of patient physical appearance/behavior: Pt is casually dressed, alert and oriented x4. Pt speaks in a clear tone, at moderate volume and normal pace. Motor behavior appears normal. Eye contact is good. Pt's mood is anxious and affect is congruent with mood. Thought process is coherent and relevant. There is no indication Pt is currently responding to internal stimuli or experiencing delusional thought content  ?What Do You Feel Would Help You the Most Today? Treatment for Depression or other mood problem  ?If access to [facility] was not available, would you have sought care in the Emergency Department? No  ?Determination of Need Routine (7 days)  ?Options For Referral Outpatient Therapy;Medication Management  ? ?DISPOSITION: Gave clinical report to Cecilio Asper, NP who completed MSE. ?

## 2021-08-28 NOTE — ED Provider Notes (Signed)
Behavioral Health Urgent Care Medical Screening Exam ? ?Patient Name: Steven Rhodes ?MRN: [id number] ?Date of Evaluation: 08/28/21 ?Chief Complaint:   ?Diagnosis:  ?Final diagnoses:  ?MDD (major depressive disorder), recurrent episode, moderate (HCC)  ?GAD (generalized anxiety disorder)  ? ? ?History of Present illness: Steven Rhodes is a 21 y.o. male with psychiatric history of adjustment disorder, anxiety and depression.  Patient presented voluntarily to [facility] for a walk-in assessment.  Patient presents with chief complaint of anger, worsening anxiety, and depression. ? ?Patient was seen face-to-face and his chart was reviewed by this nurse practitioner. On assessment, he is alert and oriented x4.  Patient is restless but cooperative. He is speaking in a normal tone of voice at moderate rate with good eye contact. Patient's mood is anxious with congruent affect.  His thought process is coherent. There is no objective sign that patient is responding to any internal/external stimuli or experiencing any delusional thought content. ? ?Patient shares that he has "anger problems." He reports that he was in a verbal altercation with his girlfriend.  He states " I feel like I am going to explode and break down."  He shares that his girlfriend recently informed him that she has been cheating on him. He reports that he has been feeling overwhelmed and depressed. He reports that he is under a lot of stress due to relationship problems and financial difficulties. He says he has been experiencing "panic attacks and emotional breakdowns." He shares that he engaged in self-harming prior to this assessment by "scratching up my arm" to relieve stress. He is noted with superficial scratch marks to left forearm. He says he is experiencing worsening depressive mood and acknowledges depressive symptoms of worthlessness, guilt, poor appetite, poor sleep, isolation, racing thoughts, and crying  spells. ? ?Patient says he is able to contract for safety. He denies suicidal and homicidal ideations. He reports past hx of suicidal attempt by hanging at age 26 year old. He  denies hallucination, paranoia, and substance abuse.    ? ?Psychiatric Specialty Exam ? ?Presentation  ?General Appearance:Appropriate for Environment ? ?Eye Contact:Good ? ?Speech:Clear and Coherent ? ?Speech Volume:Normal ? ?Handedness:Right ? ? ?Mood and Affect  ?Mood:Anxious ? ?Affect:Congruent ? ? ?Thought Process  ?Thought Processes:Coherent ? ?Descriptions of Associations:Intact ? ?Orientation:Full (Time, Place and Person) ? ?Thought Content:WDL ? Diagnosis of Schizophrenia or Schizoaffective disorder in past: No data recorded  Hallucinations:None ? ?Ideas of Reference:None ? ?Suicidal Thoughts:No ? ?Homicidal Thoughts:No ? ? ?Sensorium  ?Memory:Immediate Good; Recent Good; Remote Good ? ?Judgment:Fair ? ?Insight:Good ? ? ?Executive Functions  ?Concentration:Good ? ?Attention Span:Good ? ?Recall:Good ? ?Fund of Knowledge:Good ? ?Language:Good ? ? ?Psychomotor Activity  ?Psychomotor Activity:Normal ? ? ?Assets  ?Assets:Communication Skills; Desire for Improvement; Housing; Physical Health; Transportation; Social Support; Vocational/Educational ? ? ?Sleep  ?Sleep:Poor ? ?Number of hours: 4 ? ? ?No data recorded ? ?Physical Exam: ?Physical Exam ?Vitals and nursing note reviewed.  ?Constitutional:   ?   General: He is not in acute distress. ?   Appearance: He is well-developed.  ?HENT:  ?   Head: Normocephalic and atraumatic.  ?Eyes:  ?   Conjunctiva/sclera: Conjunctivae normal.  ?Cardiovascular:  ?   Rate and Rhythm: Normal rate and regular rhythm.  ?   Heart sounds: No murmur heard. ?Pulmonary:  ?   Effort: Pulmonary effort is normal. No respiratory distress.  ?   Breath sounds: Normal breath sounds.  ?Abdominal:  ?   Palpations: Abdomen is soft.  ?  Tenderness: There is no abdominal tenderness.  ?Musculoskeletal:     ?   General: No  swelling.  ?   Cervical back: Normal range of motion.  ?Skin: ?   General: Skin is warm.  ?   Capillary Refill: Capillary refill takes less than 2 seconds.  ?   Comments: Superficial scratch marks to left inner forearm  ?Neurological:  ?   Mental Status: He is alert and oriented to person, place, and time.  ?Psychiatric:     ?   Attention and Perception: Attention and perception normal.     ?   Mood and Affect: Mood is anxious and depressed.     ?   Speech: Speech normal.     ?   Behavior: Behavior normal. Behavior is cooperative.     ?   Thought Content: Thought content normal.     ?   Cognition and Memory: Cognition normal.  ? ?Review of Systems  ?Constitutional: Negative.   ?HENT: Negative.    ?Eyes: Negative.   ?Respiratory: Negative.    ?Cardiovascular: Negative.   ?Gastrointestinal: Negative.   ?Genitourinary: Negative.   ?Musculoskeletal: Negative.   ?Skin: Negative.   ?Neurological: Negative.   ?Endo/Heme/Allergies: Negative.   ?Psychiatric/Behavioral:  Positive for depression. The patient is nervous/anxious.   ?Blood pressure 123/70, pulse 69, temperature 98.7 ?F (37.1 ?C), resp. rate 16, SpO2 100 %. There is no height or weight on file to calculate BMI. ? ?Musculoskeletal: ?Strength & Muscle Tone: within normal limits ?Gait & Station: normal ?Patient leans: Right ? ? ?[facility] MSE Discharge Disposition for Follow up and Recommendations: ?Based on my evaluation the patient does not appear to have an emergency medical condition and can be discharged with resources and follow up care in outpatient services for Medication Management and Individual Therapy ? ?-Discussed treatment treatment options with patient.  Patient was provided a list of outpatient psychiatric services as well as information for Open Access here at [facility].  Patient was also started on sertraline and hydroxyzine  ? ?-30-day prescriptions x2 refill for sertraline 25 mg/day for depression. ?-30-day prescriptions x2 refill for hydroxyzine 25 mg  3 times daily as needed for anxiety. ? ?No evidence of imminent danger to self or others at this time. Patient does not meet criteria for psychiatric admission or IVC. Supportive therapy provided about ongoing stressors. Discussed crisis plan, callling [phone] or going to Emergency Dept ? ? ?Maricela Bo, NP ?08/28/2021, 5:35 AM ? ?

## 2021-09-06 ENCOUNTER — Encounter (HOSPITAL_COMMUNITY): Payer: Self-pay

## 2021-09-06 ENCOUNTER — Ambulatory Visit (HOSPITAL_COMMUNITY)
Admission: RE | Admit: 2021-09-06 | Discharge: 2021-09-06 | Disposition: A | Discharge status: Home / Self Care | Payer: BC Managed Care – PPO | Source: Ambulatory Visit

## 2021-09-06 ENCOUNTER — Other Ambulatory Visit: Payer: Self-pay

## 2021-09-06 VITALS — BP 112/76 | HR 102 | Temp 98.5°F | Resp 18

## 2021-09-06 DIAGNOSIS — F32A Depression, unspecified: Secondary | ICD-10-CM

## 2021-09-06 DIAGNOSIS — F419 Anxiety disorder, unspecified: Secondary | ICD-10-CM | POA: Diagnosis not present

## 2021-09-06 NOTE — ED Provider Notes (Signed)
?[facility] ? ? ? ?CSN: [id number] ?Arrival date & time: 09/06/21  1348 ? ? ?  ? ?History   ?Chief Complaint ?Chief Complaint  ?Patient presents with  ? Anxiety  ? ? ?HPI ?Steven Rhodes is a 21 y.o. male.  ? ?Patient reports he is here because he does not know what to do.  He has FMLA paperwork that needs to be filled out for his mental health.  It is due next week.  He does not have a primary care provider currently and reports he has not had one since he was 21 years old.   ? ?He reports he has struggled with mental health his whole life.  He was recently seen in behavioral health urgent care and given a list of psychiatrists to establish care with for long term maintenance of his mental health.  He was recently started on Vistaril and sertraline, however he did not pick up the sertraline yet because he is nervous about how much it will cost.  He reports the Vistaril is helping him sleep at night. ? ?He denies any current suicidal or homicidal ideation. ? ? ?Past Medical History:  ?Diagnosis Date  ? ADHD (attention deficit hyperactivity disorder)   ? Concussion   ? 4-5 per pt  ? COVID-19   ? x 2 in 2021  ? Fracture, radius, distal 03/16/2011  ? FRACTURED TOOTH 08/29/2009  ? Qualifier: Diagnosis of  By: Sheffield SliderHale MD, Deniece PortelaWayne    ? Premature birth   ? Testicular torsion   ? ? ?Patient Active Problem List  ? Diagnosis Date Noted  ? Intermittent explosive disorder in adult   ? Gastroenteritis 11/27/2017  ? Self-injurious behavior 05/10/2017  ? 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 Depression   ? Depression with anxiety 03/04/2017  ? H/O sexually transmitted disease 08/23/2016  ? Abdominal pain 06/25/2014  ? Oppositional defiant behavior 06/18/2011  ? Tinea corporis 10/02/2010  ? PREMATURE BIRTH 08/29/2009  ? VISUAL ACUITY, DECREASED 09/30/2007  ? Attention deficit hyperactivity disorder (ADHD) 07/25/2006  ? ? ?Past Surgical History:  ?Procedure Laterality Date   ? ORCHIOPEXY  05/09/2012  ? Procedure: ORCHIOPEXY PEDIATRIC;  Surgeon: Valetta Fulleravid S Grapey, MD;  [facility];  Service: Urology;  Laterality: Bilateral;  ? SURGERY SCROTAL / TESTICULAR    ? TESTICLE TORSION REDUCTION  05/09/2012  ? Procedure: TESTICULAR TORSION REPAIR;  Surgeon: Valetta Fulleravid S Grapey, MD;  [facility];  Service: Urology;  Laterality: N/A;  ? ? ? ? ? ?Home Medications   ? ?Prior to Admission medications   ?Medication Sig Start Date End Date Taking? Authorizing Provider  ?cetirizine (ZYRTEC ALLERGY) 10 MG tablet Take 1 tablet (10 mg total) by mouth daily. ?Patient taking differently: Take 10 mg by mouth daily as needed for allergies. 10/17/19   Wallis BambergMani, Mario, PA-C  ?hydrOXYzine (ATARAX) 25 MG tablet Take 1 tablet (25 mg total) by mouth 3 (three) times daily as needed. 08/28/21   Ajibola, Gerrianne ScaleEne A, NP  ?ondansetron (ZOFRAN) 4 MG tablet Take 1 tablet (4 mg total) by mouth every 6 (six) hours as needed for nausea or vomiting. 08/10/21   Gilda CreasePollina, Christopher J, MD  ?sertraline (ZOLOFT) 25 MG tablet Take 1 tablet (25 mg total) by mouth daily. 08/28/21 08/28/22  Ajibola, Gerrianne ScaleEne A, NP  ?SUMAtriptan (IMITREX) 50 MG tablet Take 1 tablet (50 mg total) by mouth every 2 (two) hours as needed for migraine. May repeat in 2 hours if headache  persists or recurs. 12/01/20   Valinda Hoar, NP  ? ? ?Family History ?Family History  ?Problem Relation Age of Onset  ? Hypertension Mother   ? Diabetes Mother   ? Hypertension Father   ? Diabetes Father   ? Heart Problems Father   ? ? ?Social History ?Social History  ? ?Tobacco Use  ? Smoking status: Never  ? Smokeless tobacco: Never  ?Vaping Use  ? Vaping Use: Every day  ?Substance Use Topics  ? Alcohol use: Yes  ?  Comment: rarely  ? Drug use: Never  ? ? ? ?Allergies   ?Patient has no known allergies. ? ? ?Review of Systems ?Review of Systems ?Per HPI ? ?Physical Exam ?Triage Vital Signs ?ED Triage Vitals  ?Enc Vitals Group  ?   BP 09/06/21 1413 112/76  ?   Pulse Rate 09/06/21 1413 (!) 102   ?   Resp 09/06/21 1413 18  ?   Temp 09/06/21 1413 98.5 ?F (36.9 ?C)  ?   Temp src --   ?   SpO2 09/06/21 1413 96 %  ?   Weight --   ?   Height --   ?   Head Circumference --   ?   Peak Flow --   ?   Pain Score 09/06/21 1411 0  ?   Pain Loc --   ?   Pain Edu? --   ?   Excl. in GC? --   ? ?No data found. ? ?Updated Vital Signs ?BP 112/76   Pulse (!) 102   Temp 98.5 ?F (36.9 ?C)   Resp 18   SpO2 96%  ? ?Visual Acuity ?Right Eye Distance:   ?Left Eye Distance:   ?Bilateral Distance:   ? ?Right Eye Near:   ?Left Eye Near:    ?Bilateral Near:    ? ?Physical Exam ?Vitals and nursing note reviewed.  ?Constitutional:   ?   General: He is not in acute distress. ?   Appearance: Normal appearance. He is not toxic-appearing.  ?Pulmonary:  ?   Effort: Pulmonary effort is normal. No respiratory distress.  ?Skin: ?   General: Skin is warm and dry.  ?   Coloration: Skin is not jaundiced or pale.  ?   Findings: No erythema.  ?Neurological:  ?   Mental Status: He is alert and oriented to person, place, and time.  ?Psychiatric:     ?   Behavior: Behavior is cooperative.  ? ? ? ?UC Treatments / Results  ?Labs ?(all labs ordered are listed, but only abnormal results are displayed) ?Labs Reviewed - No data to display ? ?EKG ? ? ?Radiology ?No results found. ? ?Procedures ?Procedures (including critical care time) ? ?Medications Ordered in UC ?Medications - No data to display ? ?Initial Impression / Assessment and Plan / UC Course  ?I have reviewed the triage vital signs and the nursing notes. ? ?Pertinent labs & imaging results that were available during my care of the patient were reviewed by me and considered in my medical decision making (see chart for details). ? ?  ?Please set up an appointment with a primary care provider for next Monday, which is before his FMLA paperwork is due.  I encouraged him to keep this appointment and discuss the paperwork with them.  We discussed that urgent cares are not able to fill out FMLA  paperwork because this requires close follow-up.   ? ?Additionally, I encouraged him to pick up the sertraline  25 mg and start on this medication daily.  I also encouraged him to reach out to the psychiatrists on the list that are accepting new patients and establish care with one of them, even if the first appointment is a few months away.  The patient was given the opportunity to ask questions.  All questions answered to their satisfaction.  The patient is in agreement to this plan.  ? ?Final Clinical Impressions(s) / UC Diagnoses  ? ?Final diagnoses:  ?Anxiety  ?Depression, unspecified depression type  ? ? ? ?Discharge Instructions   ? ?  ?- We have scheduled you an appointment with a primary care provider for Monday at 1:20. ?- Please call and schedule an appointment with a Psychiatrist for long term management of your mental health ?- Please pick up and start on the sertraline that was previously prescribed for you ? ? ? ?ED Prescriptions   ?None ?  ? ?PDMP not reviewed this encounter. ?  ?Valentino Nose, NP ?09/06/21 1512 ? ?

## 2021-09-06 NOTE — Discharge Instructions (Addendum)
-   We have scheduled you an appointment with a primary care provider for Monday at 1:20. ?- Please call and schedule an appointment with a Psychiatrist for long term management of your mental health ?- Please pick up and start on the sertraline that was previously prescribed for you ?

## 2021-09-06 NOTE — ED Triage Notes (Signed)
Pt was seen at [facility] on [address] . Pt was started on Vistaril but med has not helped with anxiety. Pt needs a referral to MD for FMLA papers. Pt has not been able to get  FMLA papers filled out. ?

## 2021-09-09 ENCOUNTER — Other Ambulatory Visit: Payer: Self-pay

## 2021-09-09 ENCOUNTER — Emergency Department (HOSPITAL_BASED_OUTPATIENT_CLINIC_OR_DEPARTMENT_OTHER)
Admission: EM | Admit: 2021-09-09 | Discharge: 2021-09-09 | Disposition: A | Discharge status: Home / Self Care | Payer: BC Managed Care – PPO | Attending: Student | Admitting: Student

## 2021-09-09 ENCOUNTER — Encounter (HOSPITAL_BASED_OUTPATIENT_CLINIC_OR_DEPARTMENT_OTHER): Payer: Self-pay | Admitting: *Deleted

## 2021-09-09 ENCOUNTER — Emergency Department (HOSPITAL_BASED_OUTPATIENT_CLINIC_OR_DEPARTMENT_OTHER): Payer: BC Managed Care – PPO

## 2021-09-09 DIAGNOSIS — S6992XA Unspecified injury of left wrist, hand and finger(s), initial encounter: Secondary | ICD-10-CM | POA: Diagnosis present

## 2021-09-09 DIAGNOSIS — Z8616 Personal history of COVID-19: Secondary | ICD-10-CM | POA: Diagnosis not present

## 2021-09-09 DIAGNOSIS — W268XXA Contact with other sharp object(s), not elsewhere classified, initial encounter: Secondary | ICD-10-CM | POA: Diagnosis not present

## 2021-09-09 DIAGNOSIS — S61412A Laceration without foreign body of left hand, initial encounter: Secondary | ICD-10-CM | POA: Diagnosis not present

## 2021-09-09 DIAGNOSIS — Z23 Encounter for immunization: Secondary | ICD-10-CM | POA: Diagnosis not present

## 2021-09-09 MED ORDER — LIDOCAINE HCL (PF) 1 % IJ SOLN
INTRAMUSCULAR | Status: AC
  Filled 2021-09-09: qty 5

## 2021-09-09 MED ORDER — TETANUS-DIPHTH-ACELL PERTUSSIS 5-2.5-18.5 LF-MCG/0.5 IM SUSY
0.5000 mL | PREFILLED_SYRINGE | Freq: Once | INTRAMUSCULAR | Status: AC
  Administered 2021-09-09: 0.5 mL via INTRAMUSCULAR
  Filled 2021-09-09 (×2): qty 0.5

## 2021-09-09 MED ORDER — LIDOCAINE HCL (PF) 1 % IJ SOLN
5.0000 mL | Freq: Once | INTRAMUSCULAR | Status: AC
Start: 2021-09-09 — End: 2021-09-09
  Administered 2021-09-09: 5 mL via INTRADERMAL
  Filled 2021-09-09: qty 5

## 2021-09-09 MED ORDER — HYDROCODONE-ACETAMINOPHEN 5-325 MG PO TABS
1.0000 | ORAL_TABLET | Freq: Once | ORAL | Status: AC
  Administered 2021-09-09: 1 via ORAL
  Filled 2021-09-09: qty 1

## 2021-09-09 NOTE — ED Triage Notes (Signed)
Patient was fishing and slipped in the mud and had machete in hand. Fell on [address] with hand still gripped around it.  ? ?Patient with deep laceration to left palm from thumb crease to middle of palm. Patient actively bleeding in triage. Dirty shirt removed and dressing placed in triage.  ? ?Patient is able to wiggle all fingers. Decreased sensation in fingertips to all fingers.  ? ?Patient with anxiety.  ?

## 2021-09-09 NOTE — ED Provider Notes (Signed)
?[facility] EMERGENCY DEPT ?Provider Note ? ?CSN: [id number] ?Arrival date & time: 09/09/21 2003 ? ?Chief Complaint(s) ?Extremity Laceration ? ?HPI ?Steven Rhodes is a 21 y.o. male who presents the emergency department for evaluation of a left hand laceration.  Patient states that he was holding a machete when he slipped in the mud and cut his hand with a machete.  Denies additional traumatic injuries.  Unsure of last tetanus dose.  Denies numbness, tingling, weakness of the extremity.  Pulses intact.  Bleeding controlled on arrival. ? ?HPI ? ?Past Medical History ?Past Medical History:  ?Diagnosis Date  ? ADHD (attention deficit hyperactivity disorder)   ? Concussion   ? 4-5 per pt  ? COVID-19   ? x 2 in 2021  ? Fracture, radius, distal 03/16/2011  ? FRACTURED TOOTH 08/29/2009  ? Qualifier: Diagnosis of  By: Sheffield SliderHale MD, Deniece PortelaWayne    ? Premature birth   ? Testicular torsion   ? ?Patient Active Problem List  ? Diagnosis Date Noted  ? Intermittent explosive disorder in adult   ? Gastroenteritis 11/27/2017  ? Self-injurious behavior 05/10/2017  ? 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 Depression   ? Depression with anxiety 03/04/2017  ? H/O sexually transmitted disease 08/23/2016  ? Abdominal pain 06/25/2014  ? Oppositional defiant behavior 06/18/2011  ? Tinea corporis 10/02/2010  ? PREMATURE BIRTH 08/29/2009  ? VISUAL ACUITY, DECREASED 09/30/2007  ? Attention deficit hyperactivity disorder (ADHD) 07/25/2006  ? ?Home Medication(s) ?Prior to Admission medications   ?Medication Sig Start Date End Date Taking? Authorizing Provider  ?cetirizine (ZYRTEC ALLERGY) 10 MG tablet Take 1 tablet (10 mg total) by mouth daily. ?Patient taking differently: Take 10 mg by mouth daily as needed for allergies. 10/17/19   Wallis BambergMani, Mario, PA-C  ?hydrOXYzine (ATARAX) 25 MG tablet Take 1 tablet (25 mg total) by mouth 3 (three) times daily as needed. 08/28/21   Ajibola, Gerrianne ScaleEne A, NP   ?ondansetron (ZOFRAN) 4 MG tablet Take 1 tablet (4 mg total) by mouth every 6 (six) hours as needed for nausea or vomiting. 08/10/21   Gilda CreasePollina, Christopher J, MD  ?sertraline (ZOLOFT) 25 MG tablet Take 1 tablet (25 mg total) by mouth daily. 08/28/21 08/28/22  Ajibola, Gerrianne ScaleEne A, NP  ?SUMAtriptan (IMITREX) 50 MG tablet Take 1 tablet (50 mg total) by mouth every 2 (two) hours as needed for migraine. May repeat in 2 hours if headache persists or recurs. 12/01/20   Valinda HoarWhite, Adrienne R, NP  ?                                                                                                                                  ?Past Surgical History ?Past Surgical History:  ?Procedure Laterality Date  ? ORCHIOPEXY  05/09/2012  ? Procedure: ORCHIOPEXY PEDIATRIC;  Surgeon: Valetta Fulleravid S Grapey, MD;  [facility];  Service: Urology;  Laterality: Bilateral;  ? SURGERY SCROTAL /  TESTICULAR    ? TESTICLE TORSION REDUCTION  05/09/2012  ? Procedure: TESTICULAR TORSION REPAIR;  Surgeon: Valetta Fuller, MD;  Location: [facility] OR;  Service: Urology;  Laterality: N/A;  ? ?Family History ?Family History  ?Problem Relation Age of Onset  ? Hypertension Mother   ? Diabetes Mother   ? Hypertension Father   ? Diabetes Father   ? Heart Problems Father   ? ? ?Social History ?Social History  ? ?Tobacco Use  ? Smoking status: Never  ? Smokeless tobacco: Never  ?Vaping Use  ? Vaping Use: Every day  ?Substance Use Topics  ? Alcohol use: Yes  ?  Comment: rarely  ? Drug use: Never  ? ?Allergies ?Patient has no known allergies. ? ?Review of Systems ?Review of Systems  ?Skin:  Positive for wound.  ? ?Physical Exam ?Vital Signs  ?I have reviewed the triage vital signs ?BP 131/82 (BP Location: Right Arm)   Pulse 66   Temp 98.9 ?F (37.2 ?C)   Resp 16   SpO2 98%  ? ?Physical Exam ?Vitals and nursing note reviewed.  ?Constitutional:   ?   General: He is not in acute distress. ?   Appearance: He is well-developed.  ?HENT:  ?   Head: Normocephalic and atraumatic.  ?Eyes:  ?    Conjunctiva/sclera: Conjunctivae normal.  ?Cardiovascular:  ?   Rate and Rhythm: Normal rate and regular rhythm.  ?   Heart sounds: No murmur heard. ?Pulmonary:  ?   Effort: Pulmonary effort is normal. No respiratory distress.  ?   Breath sounds: Normal breath sounds.  ?Abdominal:  ?   Palpations: Abdomen is soft.  ?   Tenderness: There is no abdominal tenderness.  ?Musculoskeletal:     ?   General: Tenderness present. No swelling.  ?   Cervical back: Neck supple.  ?Skin: ?   General: Skin is warm and dry.  ?   Capillary Refill: Capillary refill takes less than 2 seconds.  ?   Findings: Lesion (6 cm laceration across the palmar surface of the hand) present.  ?Neurological:  ?   Mental Status: He is alert.  ?Psychiatric:     ?   Mood and Affect: Mood normal.  ? ? ?ED Results and Treatments ?Labs ?(all labs ordered are listed, but only abnormal results are displayed) ?Labs Reviewed - No data to display                                                                                                                       ? ?Radiology ?DG Hand Complete Left ? ?Result Date: 09/09/2021 ?CLINICAL DATA:  Right hand laceration EXAM: LEFT HAND - COMPLETE 3+ VIEW COMPARISON:  None. FINDINGS: There is no evidence of fracture or dislocation. There is no evidence of arthropathy or other focal bone abnormality. A 10 mm x 7 mm superficial soft tissue laceration is seen along the region of the thenar eminence. IMPRESSION: Soft tissue laceration along the region of the  thenar eminence without evidence of an acute osseous abnormality. Electronically Signed   By: Aram Candela M.D.   On: 09/09/2021 21:48   ? ?Pertinent labs & imaging results that were available during my care of the patient were reviewed by me and considered in my medical decision making (see MDM for details). ? ?Medications Ordered in ED ?Medications  ?lidocaine (PF) (XYLOCAINE) 1 % injection 5 mL (5 mLs Intradermal Given 09/09/21 2239)  ?HYDROcodone-acetaminophen  (NORCO/VICODIN) 5-325 MG per tablet 1 tablet (1 tablet Oral Given 09/09/21 2143)  ?Tdap (BOOSTRIX) injection 0.5 mL (0.5 mLs Intramuscular Given 09/09/21 2216)  ?lidocaine (PF) (XYLOCAINE) 1 % injection (  Given 09/09/21 2242)  ?                                                               ?                                                                    ?Procedures ?Marland Kitchen.Laceration Repair ? ?Date/Time: 09/09/2021 11:38 PM ?Performed by: Glendora Score, MD ?Authorized by: Glendora Score, MD  ? ?Laceration details:  ?  Location:  Hand ?  Hand location:  L palm ?  Length (cm):  6 ?Pre-procedure details:  ?  Preparation:  Patient was prepped and draped in usual sterile fashion ?Treatment:  ?  Area cleansed with:  Saline ?  Amount of cleaning:  Standard ?  Irrigation method:  Pressure wash ?Skin repair:  ?  Repair method:  Sutures ?  Suture size:  3-0 ?  Suture material:  Prolene ?  Suture technique:  Horizontal mattress and simple interrupted ?Approximation:  ?  Approximation:  Close ?Repair type:  ?  Repair type:  Intermediate ?Post-procedure details:  ?  Procedure completion:  Tolerated well, no immediate complications ? ?(including critical care time) ? ?Medical Decision Making / ED Course ? ? ?This patient presents to the ED for concern of hand laceration, this involves an extensive number of treatment options, and is a complaint that carries with it a high risk of complications and morbidity.  The differential diagnosis includes laceration, fracture, retained foreign body ? ?MDM: ?Patient seen emergency department for evaluation of hand laceration.  Physical exam reveals a 6 cm laceration to the palmar aspect of the left hand.  No tendon involvement.  X-ray with no fracture.  Tetanus updated.  Laceration repaired at bedside.  Patient then discharged with outpatient follow-up and suture removal in 1 week. ? ? ?Additional history obtained: ? ?-External records from outside source obtained and reviewed including:  Chart review including previous notes, labs, imaging, consultation notes ? ? ?Lab Tests: ?-I ordered, reviewed, and interpreted labs.   ?The pertinent results include:   ?Labs Reviewed - No data to display  ? ? ? ?Imaging

## 2021-09-09 NOTE — ED Notes (Signed)
Suture cart at bedside 

## 2021-09-11 ENCOUNTER — Ambulatory Visit: Payer: BC Managed Care – PPO | Admitting: Family

## 2021-09-20 ENCOUNTER — Emergency Department (HOSPITAL_BASED_OUTPATIENT_CLINIC_OR_DEPARTMENT_OTHER)
Admission: EM | Admit: 2021-09-20 | Discharge: 2021-09-20 | Disposition: A | Discharge status: Home / Self Care | Payer: BC Managed Care – PPO | Attending: Emergency Medicine | Admitting: Emergency Medicine

## 2021-09-20 ENCOUNTER — Other Ambulatory Visit: Payer: Self-pay

## 2021-09-20 ENCOUNTER — Encounter (HOSPITAL_BASED_OUTPATIENT_CLINIC_OR_DEPARTMENT_OTHER): Payer: Self-pay

## 2021-09-20 DIAGNOSIS — Z4802 Encounter for removal of sutures: Secondary | ICD-10-CM | POA: Diagnosis present

## 2021-09-20 DIAGNOSIS — S61412D Laceration without foreign body of left hand, subsequent encounter: Secondary | ICD-10-CM | POA: Insufficient documentation

## 2021-09-20 DIAGNOSIS — W268XXD Contact with other sharp object(s), not elsewhere classified, subsequent encounter: Secondary | ICD-10-CM | POA: Insufficient documentation

## 2021-09-20 DIAGNOSIS — Z5189 Encounter for other specified aftercare: Secondary | ICD-10-CM

## 2021-09-20 NOTE — ED Triage Notes (Addendum)
Laceration to left hand sutures placed 9 days ago. Wound appears macerated.  States wound has been draining purulent drainage ?

## 2021-09-20 NOTE — ED Provider Notes (Signed)
? ?[facility] ?Provider Note: [name], MD, FACEP ? ?CSN: [id number] ?MRN: [id number] ?ARRIVAL: 09/20/21 at 0000 ?ROOM: [id number]/DB009 ? ? ?CHIEF COMPLAINT  ?Wound Check ? ? ?HISTORY OF PRESENT ILLNESS  ?09/20/21 1:54 AM ?Steven Rhodes is a 21 y.o. male who had a left hand laceration that was sutured on 09/09/2021.  He cut out on a machete when he slipped in the mud.  He returns stating the wound has been draining purulent drainage but associated pain is only a 2 out of 10, aching in nature, and intermittent.  He continued to have numbness in his left index finger which has been present since the accident. ? ? ?Past Medical History:  ?Diagnosis Date  ? ADHD (attention deficit hyperactivity disorder)   ? Concussion   ? 4-5 per pt  ? COVID-19   ? x 2 in 2021  ? Fracture, radius, distal 03/16/2011  ? FRACTURED TOOTH 08/29/2009  ? Qualifier: Diagnosis of  By: Sheffield Slider MD, Deniece Portela    ? Premature birth   ? Testicular torsion   ? ? ?Past Surgical History:  ?Procedure Laterality Date  ? ORCHIOPEXY  05/09/2012  ? Procedure: ORCHIOPEXY PEDIATRIC;  Surgeon: Valetta Fuller, MD;  Location: [facility] OR;  Service: Urology;  Laterality: Bilateral;  ? SURGERY SCROTAL / TESTICULAR    ? TESTICLE TORSION REDUCTION  05/09/2012  ? Procedure: TESTICULAR TORSION REPAIR;  Surgeon: Valetta Fuller, MD;  Location: [facility] OR;  Service: Urology;  Laterality: N/A;  ? ? ?Family History  ?Problem Relation Age of Onset  ? Hypertension Mother   ? Diabetes Mother   ? Hypertension Father   ? Diabetes Father   ? Heart Problems Father   ? ? ?Social History  ? ?Tobacco Use  ? Smoking status: Never  ? Smokeless tobacco: Never  ?Vaping Use  ? Vaping Use: Every day  ?Substance Use Topics  ? Alcohol use: Yes  ?  Comment: rarely  ? Drug use: Never  ? ? ?Prior to Admission medications   ?Medication Sig Start Date End Date Taking? Authorizing Provider  ?cetirizine (ZYRTEC ALLERGY) 10 MG tablet Take 1 tablet (10 mg total) by mouth daily. ?Patient taking  differently: Take 10 mg by mouth daily as needed for allergies. 10/17/19   Wallis Bamberg, PA-C  ?hydrOXYzine (ATARAX) 25 MG tablet Take 1 tablet (25 mg total) by mouth 3 (three) times daily as needed. 08/28/21   Ajibola, Gerrianne Scale A, NP  ?ondansetron (ZOFRAN) 4 MG tablet Take 1 tablet (4 mg total) by mouth every 6 (six) hours as needed for nausea or vomiting. 08/10/21   Gilda Crease, MD  ?sertraline (ZOLOFT) 25 MG tablet Take 1 tablet (25 mg total) by mouth daily. 08/28/21 08/28/22  Ajibola, Gerrianne Scale A, NP  ?SUMAtriptan (IMITREX) 50 MG tablet Take 1 tablet (50 mg total) by mouth every 2 (two) hours as needed for migraine. May repeat in 2 hours if headache persists or recurs. 12/01/20   Valinda Hoar, NP  ? ? ?Allergies ?Patient has no known allergies. ? ? ?REVIEW OF SYSTEMS  ?Negative except as noted here or in the History of Present Illness. ? ? ?PHYSICAL EXAMINATION  ?Initial Vital Signs ?Blood pressure 117/81, pulse 73, temperature 98.4 ?F (36.9 ?C), resp. rate 17, height 5\' 10"  (1.778 m), weight 99.8 kg, SpO2 99 %. ? ?Examination ?General: Well-developed, well-nourished male in no acute distress; appearance consistent with age of record ?HENT: normocephalic; atraumatic ?Eyes: Normal appearance ?Neck: supple ?Heart: regular rate and rhythm ?  Lungs: clear to auscultation bilaterally ?Abdomen: soft; nondistended; nontender; bowel sounds present ?Extremities: No deformity; full range of motion ?Neurologic: Awake, alert and oriented; motor function intact in all extremities and symmetric; no facial droop ?Skin: Warm and dry; sutured wound of left hand with evidence of maceration but no drainage, erythema or warmth: ? ? ? ? ? ?Psychiatric: Normal mood and affect ? ? ?RESULTS  ?Summary of this visit's results, reviewed and interpreted by myself: ? ? EKG Interpretation ? ?Date/Time:    ?Ventricular Rate:    ?PR Interval:    ?QRS Duration:   ?QT Interval:    ?QTC Calculation:   ?R Axis:     ?Text Interpretation:   ?  ? ?   ? ?Laboratory Studies: ?No results found for this or any previous visit (from the past 24 hour(s)). ?Imaging Studies: ?No results found. ? ?ED COURSE and MDM  ?Nursing notes, initial and subsequent vitals signs, including pulse oximetry, reviewed and interpreted by myself. ? ?Vitals:  ? 09/20/21 0035 09/20/21 0040  ?BP: 117/81   ?Pulse: 73   ?Resp: 17   ?Temp: 98.4 ?F (36.9 ?C)   ?SpO2: 99%   ?Weight:  99.8 kg  ?Height:  5\' 10"  (1.778 m)  ? ?Medications - No data to display ? ?Sutures removed and wound left open to dry as the wound appears macerated due to being covered since initial treatment.  No signs of infection.  Specifically no drainage, erythema or warmth. ? ?PROCEDURES  ?.Suture Removal ? ?Date/Time: 09/20/2021 2:08 AM ?Performed by: Kobi Mario, MD ?Authorized by: Khalaya Mcgurn, MD  ? ?Consent:  ?  Consent obtained:  Verbal ?  Risks, benefits, and alternatives were discussed: yes   ?  Risks discussed:  Bleeding, pain and wound separation ?  Alternatives discussed:  Delayed treatment ?Universal protocol:  ?  Procedure explained and questions answered to patient or proxy's satisfaction: yes   ?  Required blood products, implants, devices, and special equipment available: yes   ?  Immediately prior to procedure, a time out was called: yes   ?  Patient identity confirmed:  Arm band and verbally with patient ?Location:  ?  Location:  Upper extremity ?  Upper extremity location:  Hand ?  Hand location:  L hand ?Procedure details:  ?  Wound appearance:  No signs of infection (macerated) ?  Number of sutures removed:  5 ?Post-procedure details:  ?  Post-removal:  No dressing applied ?  Procedure completion:  Tolerated well, no immediate complications ? ? ?ED DIAGNOSES  ? ?  ICD-10-CM   ?1. Visit for wound check  Z51.89   ?  ?2. Visit for suture removal  Z48.02   ?  ? ? ? ?  ?09/22/2021, MD ?09/20/21 0211 ? ?

## 2021-09-24 ENCOUNTER — Telehealth (HOSPITAL_COMMUNITY): Payer: Self-pay

## 2021-09-24 NOTE — BH Assessment (Signed)
Care Management - [facility] Follow Up Discharges  ° °Writer attempted to make contact with patient today and was unsuccessful.  Writer left a HIPPA compliant voice message.  ° °Per chart review, patient was provided with outpatient resources. ° °

## 2022-01-23 ENCOUNTER — Other Ambulatory Visit: Payer: Self-pay

## 2022-01-23 ENCOUNTER — Encounter (HOSPITAL_BASED_OUTPATIENT_CLINIC_OR_DEPARTMENT_OTHER): Payer: Self-pay

## 2022-01-23 ENCOUNTER — Emergency Department (HOSPITAL_BASED_OUTPATIENT_CLINIC_OR_DEPARTMENT_OTHER)
Admission: EM | Admit: 2022-01-23 | Discharge: 2022-01-23 | Disposition: A | Discharge status: Home / Self Care | Payer: BC Managed Care – PPO | Attending: Emergency Medicine | Admitting: Emergency Medicine

## 2022-01-23 DIAGNOSIS — X58XXXA Exposure to other specified factors, initial encounter: Secondary | ICD-10-CM | POA: Insufficient documentation

## 2022-01-23 DIAGNOSIS — S025XXA Fracture of tooth (traumatic), initial encounter for closed fracture: Secondary | ICD-10-CM | POA: Insufficient documentation

## 2022-01-23 DIAGNOSIS — Y9389 Activity, other specified: Secondary | ICD-10-CM | POA: Insufficient documentation

## 2022-01-23 MED ORDER — HYDROCODONE-ACETAMINOPHEN 5-325 MG PO TABS: 1.0000 | ORAL_TABLET | Freq: Four times a day (QID) | ORAL | 0 refills | Status: DC | PRN

## 2022-01-23 MED ORDER — HYDROCODONE-ACETAMINOPHEN 5-325 MG PO TABS
1.0000 | ORAL_TABLET | Freq: Once | ORAL | Status: AC
  Administered 2022-01-23: 1 via ORAL
  Filled 2022-01-23: qty 1

## 2022-01-23 MED ORDER — CLINDAMYCIN HCL 150 MG PO CAPS: 450.0000 mg | ORAL_CAPSULE | Freq: Three times a day (TID) | ORAL | 0 refills | Status: AC

## 2022-01-23 NOTE — ED Provider Notes (Signed)
[facility] EMERGENCY DEPT Provider Note   CSN: [id number] Arrival date & time: 01/23/22  0331     History  Chief Complaint  Patient presents with   Dental Problem    Steven Rhodes is a 21 y.o. male.  HPI   21 year old male presenting to the emergency department with dental pain.  The patient states that he was eating some corn last night when he chipped his front incisor.  His front left incisor sustained multiple fractures through the tooth.  He has had problems with this before.  He currently does not have a dentist to follow-up with.  Denies any fevers or chills, swelling about the tooth. He endorses pain about the affected tooth.  Home Medications Prior to Admission medications   Medication Sig Start Date End Date Taking? Authorizing Provider  clindamycin (CLEOCIN) 150 MG capsule Take 3 capsules (450 mg total) by mouth 3 (three) times daily for 7 days. 01/23/22 01/30/22 Yes Ernie Avena, MD  HYDROcodone-acetaminophen (NORCO/VICODIN) 5-325 MG tablet Take 1-2 tablets by mouth every 6 (six) hours as needed for severe pain. 01/23/22  Yes Ernie Avena, MD  hydrOXYzine (ATARAX) 25 MG tablet Take 1 tablet (25 mg total) by mouth 3 (three) times daily as needed. 08/28/21   Ajibola, Ene A, NP  ondansetron (ZOFRAN) 4 MG tablet Take 1 tablet (4 mg total) by mouth every 6 (six) hours as needed for nausea or vomiting. 08/10/21   Gilda Crease, MD  sertraline (ZOLOFT) 25 MG tablet Take 1 tablet (25 mg total) by mouth daily. 08/28/21 08/28/22  Ajibola, Gerrianne Scale A, NP  SUMAtriptan (IMITREX) 50 MG tablet Take 1 tablet (50 mg total) by mouth every 2 (two) hours as needed for migraine. May repeat in 2 hours if headache persists or recurs. 12/01/20   Valinda Hoar, NP      Allergies    Patient has no known allergies.    Review of Systems   Review of Systems  All other systems reviewed and are negative.   Physical Exam Updated Vital Signs BP 119/87 (BP Location:  Right Arm)   Pulse 60   Temp 97.6 F (36.4 C)   Resp 17   SpO2 100%  Physical Exam Vitals and nursing note reviewed.  Constitutional:      General: He is not in acute distress. HENT:     Head: Normocephalic and atraumatic.     Mouth/Throat:      Comments: 2 fractures noted through the left front incisor, Ellis class III Eyes:     Conjunctiva/sclera: Conjunctivae normal.     Pupils: Pupils are equal, round, and reactive to light.  Cardiovascular:     Rate and Rhythm: Normal rate and regular rhythm.  Pulmonary:     Effort: Pulmonary effort is normal. No respiratory distress.  Abdominal:     General: There is no distension.     Tenderness: There is no guarding.  Musculoskeletal:        General: No deformity or signs of injury.     Cervical back: Neck supple.  Skin:    Findings: No lesion or rash.  Neurological:     General: No focal deficit present.     Mental Status: He is alert. Mental status is at baseline.       ED Results / Procedures / Treatments   Labs (all labs ordered are listed, but only abnormal results are displayed) Labs Reviewed - No data to display  EKG None  Radiology No results  found.  Procedures Procedures    Medications Ordered in ED Medications - No data to display  ED Course/ Medical Decision Making/ A&P                           Medical Decision Making Risk Prescription drug management.    21 year old male presenting to the emergency department with dental pain.  The patient states that he was eating some corn last night when he chipped his front incisor.  His front left incisor sustained multiple fractures through the tooth.  He has had problems with this before.  He currently does not have a dentist to follow-up with.  Denies any fevers or chills, swelling about the tooth. He endorses pain about the affected tooth.  Arrival, the patient was vitally stable.  Presenting with dental fracture through the left front incisor.  2  fractures noted on exam.  Fracture fragments were stabilized with Dermabond.  Patient was provided with a prescription for Norco in addition to antibiotics and was advised to follow-up urgently with a dentist within 24 hours.  Final Clinical Impression(s) / ED Diagnoses Final diagnoses:  Closed fracture of tooth, initial encounter    Rx / DC Orders ED Discharge Orders          Ordered    HYDROcodone-acetaminophen (NORCO/VICODIN) 5-325 MG tablet  Every 6 hours PRN        01/23/22 0740    clindamycin (CLEOCIN) 150 MG capsule  3 times daily        01/23/22 0740              Ernie Avena, MD 01/23/22 [phone]

## 2022-01-23 NOTE — ED Notes (Signed)
Dermabond at bedside.  

## 2022-01-23 NOTE — ED Triage Notes (Signed)
Pt states that he was eating some corn and that he chipped his tooth

## 2022-01-23 NOTE — Discharge Instructions (Addendum)
Please follow-up with a dentist within 24 hours. Your tooth fracture augments have been dermabonded together.  Your tooth may need to be extracted.  I have prescribed antibiotics and pain medicine.

## 2022-03-03 ENCOUNTER — Other Ambulatory Visit: Payer: Self-pay

## 2022-03-03 ENCOUNTER — Encounter (HOSPITAL_BASED_OUTPATIENT_CLINIC_OR_DEPARTMENT_OTHER): Payer: Self-pay

## 2022-03-03 ENCOUNTER — Emergency Department (HOSPITAL_BASED_OUTPATIENT_CLINIC_OR_DEPARTMENT_OTHER)
Admission: EM | Admit: 2022-03-03 | Discharge: 2022-03-03 | Disposition: A | Discharge status: Home / Self Care | Payer: Self-pay | Attending: Emergency Medicine | Admitting: Emergency Medicine

## 2022-03-03 DIAGNOSIS — Z20828 Contact with and (suspected) exposure to other viral communicable diseases: Secondary | ICD-10-CM | POA: Insufficient documentation

## 2022-03-03 DIAGNOSIS — Z20822 Contact with and (suspected) exposure to covid-19: Secondary | ICD-10-CM | POA: Insufficient documentation

## 2022-03-03 LAB — SARS CORONAVIRUS 2 BY RT PCR: SARS Coronavirus 2 by RT PCR: NEGATIVE

## 2022-03-03 NOTE — ED Triage Notes (Signed)
Significant other tested positive for covid. Requesting test. Denies associated sx.

## 2022-03-03 NOTE — ED Provider Notes (Signed)
[facility] EMERGENCY Provider Note: Steven Dell, MD, FACEP  CSN: [id number] MRN: [id number] ARRIVAL: 03/03/22 at 0112 ROOM: DB010/DB010   CHIEF COMPLAINT  Covid Exposure   HISTORY OF PRESENT ILLNESS  03/03/22 2:20 AM Steven Rhodes is a 21 y.o. male who was exposed to COVID by his significant other.  He is having no symptoms but would like to be tested.  He has had COVID twice in the past.   Past Medical History:  Diagnosis Date   ADHD (attention deficit hyperactivity disorder)    Concussion    4-5 per pt   COVID-19    x 2 in 2021   Fracture, radius, distal 03/16/2011   FRACTURED TOOTH 08/29/2009   Qualifier: Diagnosis of  By: Sheffield Slider MD, Zara Chess birth    Testicular torsion     Past Surgical History:  Procedure Laterality Date   ORCHIOPEXY  05/09/2012   Procedure: ORCHIOPEXY PEDIATRIC;  Surgeon: Valetta Fuller, MD;  Location: [facility] OR;  Service: Urology;  Laterality: Bilateral;   SURGERY SCROTAL / TESTICULAR     TESTICLE TORSION REDUCTION  05/09/2012   Procedure: TESTICULAR TORSION REPAIR;  Surgeon: Valetta Fuller, MD;  Location: [facility] OR;  Service: Urology;  Laterality: N/A;    Family History  Problem Relation Age of Onset   Hypertension Mother    Diabetes Mother    Hypertension Father    Diabetes Father    Heart Problems Father     Social History   Tobacco Use   Smoking status: Never   Smokeless tobacco: Never  Vaping Use   Vaping Use: Every day  Substance Use Topics   Alcohol use: Yes    Comment: rarely   Drug use: Never    Prior to Admission medications   Medication Sig Start Date End Date Taking? Authorizing Provider  HYDROcodone-acetaminophen (NORCO/VICODIN) 5-325 MG tablet Take 1-2 tablets by mouth every 6 (six) hours as needed for severe pain. 01/23/22   Ernie Avena, MD  hydrOXYzine (ATARAX) 25 MG tablet Take 1 tablet (25 mg total) by mouth 3 (three) times daily as needed. 08/28/21   Ajibola, Ene A, NP  ondansetron (ZOFRAN) 4  MG tablet Take 1 tablet (4 mg total) by mouth every 6 (six) hours as needed for nausea or vomiting. 08/10/21   Gilda Crease, MD  sertraline (ZOLOFT) 25 MG tablet Take 1 tablet (25 mg total) by mouth daily. 08/28/21 08/28/22  Ajibola, Gerrianne Scale A, NP  SUMAtriptan (IMITREX) 50 MG tablet Take 1 tablet (50 mg total) by mouth every 2 (two) hours as needed for migraine. May repeat in 2 hours if headache persists or recurs. 12/01/20   Valinda Hoar, NP    Allergies Patient has no known allergies.   REVIEW OF SYSTEMS  Negative except as noted here or in the History of Present Illness.   PHYSICAL EXAMINATION  Initial Vital Signs Blood pressure (!) 132/92, pulse 81, temperature 98.4 F (36.9 C), resp. rate 16, height 5\' 10"  (1.778 m), weight 95.3 kg, SpO2 99 %.  Examination General: Well-developed, well-nourished male in no acute distress; appearance consistent with age of record HENT: normocephalic; atraumatic Eyes: Normal appearance Neck: supple Heart: regular rate and rhythm Lungs: clear to auscultation bilaterally Abdomen: soft; nondistended; nontender; bowel sounds present Extremities: No deformity; full range of motion; pulses normal Neurologic: Awake, alert and oriented; motor function intact in all extremities and symmetric; no facial droop Skin: Warm and dry Psychiatric: Normal mood and affect  RESULTS  Summary of this visit's results, reviewed and interpreted by myself:   EKG Interpretation  Date/Time:    Ventricular Rate:    PR Interval:    QRS Duration:   QT Interval:    QTC Calculation:   R Axis:     Text Interpretation:         Laboratory Studies: Results for orders placed or performed during the hospital encounter of 03/03/22 (from the past 24 hour(s))  SARS Coronavirus 2 by RT PCR (hospital order, performed in [facility] hospital lab) *cepheid single result test* Anterior Nasal Swab     Status: None   Collection Time: 03/03/22  1:56 AM   Specimen:  Anterior Nasal Swab  Result Value Ref Range   SARS Coronavirus 2 by RT PCR NEGATIVE NEGATIVE   Imaging Studies: No results found.  ED COURSE and MDM  Nursing notes, initial and subsequent vitals signs, including pulse oximetry, reviewed and interpreted by myself.  Vitals:   03/03/22 0152 03/03/22 0154  BP: (!) 132/92   Pulse: 81   Resp:  16  Temp:  98.4 F (36.9 C)  SpO2: 99%   Weight:  95.3 kg  Height:  5\' 10"  (1.778 m)   Medications - No data to display  COVID not detected.  As noted above, patient is asymptomatic.  PROCEDURES  Procedures   ED DIAGNOSES     ICD-10-CM   1. COVID-19 virus not detected  Z20.822     2. Exposure to COVID-19 virus  Z20.822          Shanon Rosser, MD 03/03/22 [phone]

## 2022-03-03 NOTE — ED Notes (Signed)
Pt verbalizes understanding of discharge instructions. Opportunity for questioning and answers were provided. Pt discharged from ED to home.   ? ?

## 2022-03-05 ENCOUNTER — Other Ambulatory Visit: Payer: Self-pay

## 2022-03-05 ENCOUNTER — Emergency Department (HOSPITAL_BASED_OUTPATIENT_CLINIC_OR_DEPARTMENT_OTHER)
Admission: EM | Admit: 2022-03-05 | Discharge: 2022-03-06 | Disposition: A | Discharge status: Home / Self Care | Payer: Self-pay | Attending: Emergency Medicine | Admitting: Emergency Medicine

## 2022-03-05 ENCOUNTER — Encounter (HOSPITAL_BASED_OUTPATIENT_CLINIC_OR_DEPARTMENT_OTHER): Payer: Self-pay

## 2022-03-05 DIAGNOSIS — R0981 Nasal congestion: Secondary | ICD-10-CM

## 2022-03-05 DIAGNOSIS — J349 Unspecified disorder of nose and nasal sinuses: Secondary | ICD-10-CM | POA: Insufficient documentation

## 2022-03-05 DIAGNOSIS — Z20822 Contact with and (suspected) exposure to covid-19: Secondary | ICD-10-CM | POA: Insufficient documentation

## 2022-03-05 LAB — RESP PANEL BY RT-PCR (FLU A&B, COVID) ARPGX2
Influenza A by PCR: NEGATIVE
Influenza B by PCR: NEGATIVE
SARS Coronavirus 2 by RT PCR: NEGATIVE

## 2022-03-05 NOTE — ED Triage Notes (Signed)
POV, pt c/o cough and congestion, tylenol approx 2pm, family at home has covid.

## 2022-03-06 MED ORDER — CETIRIZINE HCL 5 MG/5ML PO SOLN
10.0000 mg | Freq: Once | ORAL | Status: AC
Start: 2022-03-06 — End: 2022-03-06
  Administered 2022-03-06: 10 mg via ORAL
  Filled 2022-03-06: qty 10

## 2022-03-06 MED ORDER — DEXAMETHASONE 4 MG PO TABS
10.0000 mg | ORAL_TABLET | Freq: Once | ORAL | Status: AC
Start: 2022-03-06 — End: 2022-03-06
  Administered 2022-03-06: 10 mg via ORAL
  Filled 2022-03-06: qty 3

## 2022-03-06 NOTE — ED Provider Notes (Signed)
[facility] EMERGENCY DEPT Provider Note   CSN: [id number] Arrival date & time: 03/05/22  2301     History  Chief Complaint  Patient presents with   Nasal Congestion   Cough    Steven Rhodes is a 21 y.o. male.  Patient in household with two people who have confirmed COVID. Was tested a couple of days ago and was negative.  Was asymptomatic at that time.  Now has sore throat, cough, runny nose and stuffiness.  No fevers.  No productive cough.  No other associated symptoms.  No other sick contacts. He does vape but does not smoke tobacco.    Cough      Home Medications Prior to Admission medications   Medication Sig Start Date End Date Taking? Authorizing Provider  HYDROcodone-acetaminophen (NORCO/VICODIN) 5-325 MG tablet Take 1-2 tablets by mouth every 6 (six) hours as needed for severe pain. 01/23/22   Ernie Avena, MD  hydrOXYzine (ATARAX) 25 MG tablet Take 1 tablet (25 mg total) by mouth 3 (three) times daily as needed. 08/28/21   Ajibola, Ene A, NP  ondansetron (ZOFRAN) 4 MG tablet Take 1 tablet (4 mg total) by mouth every 6 (six) hours as needed for nausea or vomiting. 08/10/21   Gilda Crease, MD  sertraline (ZOLOFT) 25 MG tablet Take 1 tablet (25 mg total) by mouth daily. 08/28/21 08/28/22  Ajibola, Gerrianne Scale A, NP  SUMAtriptan (IMITREX) 50 MG tablet Take 1 tablet (50 mg total) by mouth every 2 (two) hours as needed for migraine. May repeat in 2 hours if headache persists or recurs. 12/01/20   Valinda Hoar, NP      Allergies    Patient has no known allergies.    Review of Systems   Review of Systems  Respiratory:  Positive for cough.     Physical Exam Updated Vital Signs BP 136/79   Pulse 97   Temp 99.6 F (37.6 C) (Oral)   Resp 16   Ht 5\' 10"  (1.778 m)   Wt 95.2 kg   SpO2 97%   BMI 30.11 kg/m  Physical Exam Vitals and nursing note reviewed.  Constitutional:      Appearance: He is well-developed.  HENT:     Head:  Normocephalic and atraumatic.     Nose: Congestion and rhinorrhea present.     Mouth/Throat:     Mouth: Mucous membranes are moist.     Pharynx: Posterior oropharyngeal erythema present. No oropharyngeal exudate.  Eyes:     Pupils: Pupils are equal, round, and reactive to light.  Cardiovascular:     Rate and Rhythm: Normal rate.  Pulmonary:     Effort: Pulmonary effort is normal. No respiratory distress.  Abdominal:     General: Abdomen is flat. There is no distension.  Musculoskeletal:        General: Normal range of motion.     Cervical back: Normal range of motion.  Skin:    General: Skin is warm and dry.  Neurological:     General: No focal deficit present.     Mental Status: He is alert.     ED Results / Procedures / Treatments   Labs (all labs ordered are listed, but only abnormal results are displayed) Labs Reviewed  RESP PANEL BY RT-PCR (FLU A&B, COVID) [id number]    EKG None  Radiology No results found.  Procedures Procedures    Medications Ordered in ED Medications  dexamethasone (DECADRON) tablet 10 mg (has no administration in time  range)  cetirizine HCl (Zyrtec) 5 MG/5ML solution 10 mg (has no administration in time range)    ED Course/ Medical Decision Making/ A&P                           Medical Decision Making Risk Prescription drug management.   Patient with either postnasal drip related to allergies versus viral sinus infection or viral pharyngitis.  Will treat supportively here.  Could also have COVID as he was exposed to multiple people with it however his test here was negative.  No indication for x-ray without focal lung findings or concern for pneumonia without fever or purulent cough.  We will continue supportive care at home.  Return if not improving.   Final Clinical Impression(s) / ED Diagnoses Final diagnoses:  Congestion of nasal sinus    Rx / DC Orders ED Discharge Orders     None         Terissa Haffey, Corene Cornea, MD 03/06/22  [phone]

## 2022-12-16 ENCOUNTER — Emergency Department (HOSPITAL_COMMUNITY)
Admission: EM | Admit: 2022-12-16 | Discharge: 2022-12-17 | Disposition: A | Discharge status: Home / Self Care | Payer: Self-pay | Attending: Emergency Medicine | Admitting: Emergency Medicine

## 2022-12-16 ENCOUNTER — Other Ambulatory Visit: Payer: Self-pay

## 2022-12-16 ENCOUNTER — Encounter (HOSPITAL_COMMUNITY): Payer: Self-pay

## 2022-12-16 DIAGNOSIS — R197 Diarrhea, unspecified: Secondary | ICD-10-CM | POA: Insufficient documentation

## 2022-12-16 DIAGNOSIS — R1084 Generalized abdominal pain: Secondary | ICD-10-CM | POA: Insufficient documentation

## 2022-12-16 DIAGNOSIS — L089 Local infection of the skin and subcutaneous tissue, unspecified: Secondary | ICD-10-CM

## 2022-12-16 LAB — URINALYSIS, ROUTINE W REFLEX MICROSCOPIC
Bilirubin Urine: NEGATIVE
Glucose, UA: NEGATIVE mg/dL
Hgb urine dipstick: NEGATIVE
Ketones, ur: NEGATIVE mg/dL
Leukocytes,Ua: NEGATIVE
Nitrite: NEGATIVE
Protein, ur: NEGATIVE mg/dL
Specific Gravity, Urine: 1.02 (ref 1.005–1.030)
pH: 6 (ref 5.0–8.0)

## 2022-12-16 LAB — COMPREHENSIVE METABOLIC PANEL
ALT: 25 U/L (ref 0–44)
AST: 36 U/L (ref 15–41)
Albumin: 3.7 g/dL (ref 3.5–5.0)
Alkaline Phosphatase: 57 U/L (ref 38–126)
Anion gap: 10 (ref 5–15)
BUN: 16 mg/dL (ref 6–20)
CO2: 26 mmol/L (ref 22–32)
Calcium: 9 mg/dL (ref 8.9–10.3)
Chloride: 104 mmol/L (ref 98–111)
Creatinine, Ser: 0.98 mg/dL (ref 0.61–1.24)
GFR, Estimated: >60 mL/min (ref >60)
Glucose, Bld: 107 mg/dL — ABNORMAL HIGH (ref 70–99)
Potassium: 3.9 mmol/L (ref 3.5–5.1)
Sodium: 140 mmol/L (ref 135–145)
Total Bilirubin: 0.4 mg/dL (ref 0.3–1.2)
Total Protein: 6.4 g/dL — ABNORMAL LOW (ref 6.5–8.1)

## 2022-12-16 LAB — CBC
HCT: 43.1 % (ref 39.0–52.0)
Hemoglobin: 14.2 g/dL (ref 13.0–17.0)
MCH: 29.2 pg (ref 26.0–34.0)
MCHC: 32.9 g/dL (ref 30.0–36.0)
MCV: 88.5 fL (ref 80.0–100.0)
Platelets: 295 10*3/uL (ref 150–400)
RBC: 4.87 MIL/uL (ref 4.22–5.81)
RDW: 13.2 % (ref 11.5–15.5)
WBC: 7.4 10*3/uL (ref 4.0–10.5)
nRBC: 0 % (ref 0.0–0.2)

## 2022-12-16 LAB — LIPASE, BLOOD: Lipase: 37 U/L (ref 11–51)

## 2022-12-16 NOTE — ED Triage Notes (Signed)
Pt arrived from home via POV c/o 1. Loss of toenail from a few weeks ago worried about infection 2. Has requested to have a complete STD check and 3. C/o abd pain x 1 week 5/10 in which he has lots of gas and multiple diarrhea stools.

## 2022-12-17 ENCOUNTER — Emergency Department (HOSPITAL_COMMUNITY): Payer: Self-pay

## 2022-12-17 LAB — GC/CHLAMYDIA PROBE AMP (~~LOC~~) NOT AT ARMC
Chlamydia: NEGATIVE
Comment: NEGATIVE
Comment: NORMAL
Neisseria Gonorrhea: NEGATIVE

## 2022-12-17 LAB — HIV ANTIBODY (ROUTINE TESTING W REFLEX): HIV Screen 4th Generation wRfx: NONREACTIVE

## 2022-12-17 LAB — RPR: RPR Ser Ql: NONREACTIVE

## 2022-12-17 MED ORDER — IOHEXOL 350 MG/ML SOLN
75.0000 mL | Freq: Once | INTRAVENOUS | Status: AC | PRN
  Administered 2022-12-17: 75 mL via INTRAVENOUS

## 2022-12-17 MED ORDER — DOXYCYCLINE HYCLATE 100 MG PO TABS
100.0000 mg | ORAL_TABLET | Freq: Once | ORAL | Status: AC
  Administered 2022-12-17: 100 mg via ORAL
  Filled 2022-12-17: qty 1

## 2022-12-17 MED ORDER — DOXYCYCLINE HYCLATE 100 MG PO CAPS: 100.0000 mg | ORAL_CAPSULE | Freq: Two times a day (BID) | ORAL | 0 refills | Status: DC

## 2022-12-17 NOTE — Discharge Instructions (Signed)
Your testing is negative.  Take the antibiotics as prescribed for a possible toe infection and follow-up with podiatry.  Return to the ED with new or worsening symptoms.
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
## 2023-01-27 ENCOUNTER — Emergency Department (HOSPITAL_BASED_OUTPATIENT_CLINIC_OR_DEPARTMENT_OTHER)
Admission: EM | Admit: 2023-01-27 | Discharge: 2023-01-27 | Disposition: A | Discharge status: Home / Self Care | Payer: Self-pay | Attending: Emergency Medicine | Admitting: Emergency Medicine

## 2023-01-27 ENCOUNTER — Other Ambulatory Visit: Payer: Self-pay

## 2023-01-27 ENCOUNTER — Encounter (HOSPITAL_BASED_OUTPATIENT_CLINIC_OR_DEPARTMENT_OTHER): Payer: Self-pay | Admitting: Emergency Medicine

## 2023-01-27 DIAGNOSIS — G43809 Other migraine, not intractable, without status migrainosus: Secondary | ICD-10-CM | POA: Insufficient documentation

## 2023-01-27 MED ORDER — PROCHLORPERAZINE EDISYLATE 10 MG/2ML IJ SOLN
10.0000 mg | Freq: Once | INTRAMUSCULAR | Status: AC
  Administered 2023-01-27: 10 mg via INTRAVENOUS
  Filled 2023-01-27: qty 2

## 2023-01-27 MED ORDER — KETOROLAC TROMETHAMINE 30 MG/ML IJ SOLN
30.0000 mg | Freq: Once | INTRAMUSCULAR | Status: AC
  Administered 2023-01-27: 30 mg via INTRAVENOUS
  Filled 2023-01-27: qty 1

## 2023-01-27 MED ORDER — SODIUM CHLORIDE 0.9 % IV BOLUS
1000.0000 mL | Freq: Once | INTRAVENOUS | Status: AC
  Administered 2023-01-27: 1000 mL via INTRAVENOUS

## 2023-01-27 NOTE — ED Provider Notes (Signed)
[place] EMERGENCY DEPARTMENT AT [facility]  Provider Note  CSN: [id number] Arrival date & time: 01/27/23 0241  History Chief Complaint  Patient presents with   Migraine    Steven Rhodes is a 22 y.o. male with prior history of migraines reports he has had 3 weeks of continuous R sided headache, behind his R eye, with photophobia. He has been taking PTC medications with minimal improvement. He has been able to sleep but headache is there when he goes to bed and there when he wakes up.He denies any vomiting. No fever. No blurry vision. He has not come sooner due to his work schedule.    Home Medications Prior to Admission medications   Medication Sig Start Date End Date Taking? Authorizing Provider  doxycycline (VIBRAMYCIN) 100 MG capsule Take 1 capsule (100 mg total) by mouth 2 (two) times daily. 12/17/22   Rancour, Jeannett Senior, MD  HYDROcodone-acetaminophen (NORCO/VICODIN) 5-325 MG tablet Take 1-2 tablets by mouth every 6 (six) hours as needed for severe pain. 01/23/22   Ernie Avena, MD  hydrOXYzine (ATARAX) 25 MG tablet Take 1 tablet (25 mg total) by mouth 3 (three) times daily as needed. 08/28/21   Ajibola, Ene A, NP  ondansetron (ZOFRAN) 4 MG tablet Take 1 tablet (4 mg total) by mouth every 6 (six) hours as needed for nausea or vomiting. 08/10/21   Gilda Crease, MD  sertraline (ZOLOFT) 25 MG tablet Take 1 tablet (25 mg total) by mouth daily. 08/28/21 08/28/22  Ajibola, Gerrianne Scale A, NP  SUMAtriptan (IMITREX) 50 MG tablet Take 1 tablet (50 mg total) by mouth every 2 (two) hours as needed for migraine. May repeat in 2 hours if headache persists or recurs. 12/01/20   Valinda Hoar, NP     Allergies    Patient has no known allergies.   Review of Systems   Review of Systems Please see HPI for pertinent positives and negatives  Physical Exam BP (!) 125/57   Pulse 76   Temp 98.5 F (36.9 C)   Resp 18   Wt 103 kg   SpO2 98%   BMI 32.58 kg/m   Physical  Exam Vitals and nursing note reviewed.  Constitutional:      Appearance: Normal appearance.  HENT:     Head: Normocephalic and atraumatic.     Nose: Nose normal.     Mouth/Throat:     Mouth: Mucous membranes are moist.  Eyes:     Extraocular Movements: Extraocular movements intact.     Conjunctiva/sclera: Conjunctivae normal.     Pupils: Pupils are equal, round, and reactive to light.  Cardiovascular:     Rate and Rhythm: Normal rate.  Pulmonary:     Effort: Pulmonary effort is normal.     Breath sounds: Normal breath sounds.  Abdominal:     General: Abdomen is flat.     Palpations: Abdomen is soft.     Tenderness: There is no abdominal tenderness.  Musculoskeletal:        General: No swelling. Normal range of motion.     Cervical back: Neck supple.  Skin:    General: Skin is warm and dry.  Neurological:     General: No focal deficit present.     Mental Status: He is alert and oriented to person, place, and time.     Cranial Nerves: No cranial nerve deficit.     Sensory: No sensory deficit.     Motor: No weakness.     Gait:  Gait normal.  Psychiatric:        Mood and Affect: Mood normal.     ED Results / Procedures / Treatments   EKG None  Procedures Procedures  Medications Ordered in the ED Medications  ketorolac (TORADOL) 30 MG/ML injection 30 mg (30 mg Intravenous Given 01/27/23 0257)  prochlorperazine (COMPAZINE) injection 10 mg (10 mg Intravenous Given 01/27/23 0257)  sodium chloride 0.9 % bolus 1,000 mL (0 mLs Intravenous Stopped 01/27/23 0326)    Initial Impression and Plan  Patient here with 3 weeks of migraine/cluster headache. Exam and vitals are reassuring. Will give migraine cocktail, IVF and 100% oxygen and reassess for improvement.   ED Course   Clinical Course as of 01/27/23 4132  Wynelle Link Jan 27, 2023  0330 Patient reports headache has resolved. He is ready to go home. Recommend PCP and/or Neuro follow up if headaches become more frequent to discuss  outpatient management. RTED for any other concerns.  [CS]    Clinical Course User Index [CS] Pollyann Savoy, MD     MDM Rules/Calculators/A&P Medical Decision Making Problems Addressed: Other migraine without status migrainosus, not intractable: acute illness or injury  Risk Prescription drug management.     Final Clinical Impression(s) / ED Diagnoses Final diagnoses:  Other migraine without status migrainosus, not intractable    Rx / DC Orders ED Discharge Orders     None        Pollyann Savoy, MD 01/27/23 [phone]

## 2023-01-27 NOTE — ED Triage Notes (Signed)
Pt presents for constant HA for 3 weeks. R sided behind eye.  Endorses photophobia  Denies N/V  Tried tylenol and advil

## 2023-01-27 NOTE — ED Notes (Signed)
Pt placed on NRB at 10L per EDP as adjunct therapy for cluster headache differential

## 2023-03-21 ENCOUNTER — Emergency Department (HOSPITAL_COMMUNITY)
Admission: EM | Admit: 2023-03-21 | Discharge: 2023-03-21 | Disposition: A | Discharge status: Home / Self Care | Payer: Self-pay | Attending: Emergency Medicine | Admitting: Emergency Medicine

## 2023-03-21 ENCOUNTER — Other Ambulatory Visit: Payer: Self-pay

## 2023-03-21 ENCOUNTER — Encounter (HOSPITAL_COMMUNITY): Payer: Self-pay | Admitting: Emergency Medicine

## 2023-03-21 ENCOUNTER — Emergency Department (HOSPITAL_COMMUNITY): Payer: Self-pay

## 2023-03-21 DIAGNOSIS — R0781 Pleurodynia: Secondary | ICD-10-CM | POA: Insufficient documentation

## 2023-03-21 DIAGNOSIS — Y92410 Unspecified street and highway as the place of occurrence of the external cause: Secondary | ICD-10-CM | POA: Diagnosis not present
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
## 2023-03-21 NOTE — ED Notes (Signed)
Pt returned from XRAY 
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
## 2023-03-21 NOTE — ED Notes (Signed)
Patient transported to X-ray 

## 2023-09-05 IMAGING — CT CT ABD-PELV W/ CM
2 of 4 series · 16 of 46 positions shown, 18 images · IV contrast (agent unspecified)
Comparison: Noncontrast CT Abdomen and Pelvis 02/13/2020.

CLINICAL DATA: 21-year-old male with abdominal pain, nausea
vomiting.

EXAM:
CT ABDOMEN AND PELVIS WITH CONTRAST
TECHNIQUE: Multidetector CT imaging of the abdomen and pelvis was performed
using the standard protocol following bolus administration of
intravenous contrast.

[Series 2: abd pel w · axial · 0.85mm/px · z∈[+608,+1063]mm · 13 of 101 slices shown, 15 images]
[im 5/101  soft-tissue]
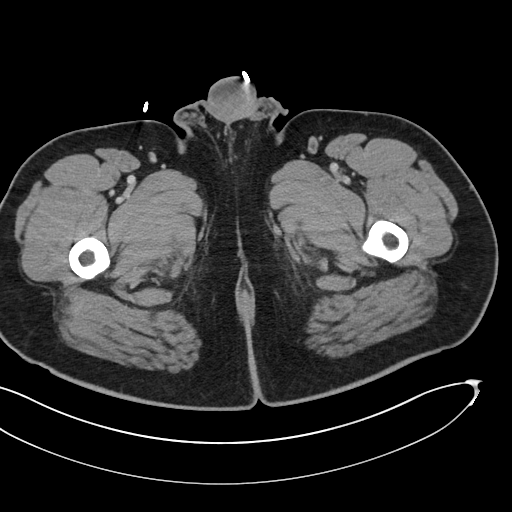
[im 5/101  bone]
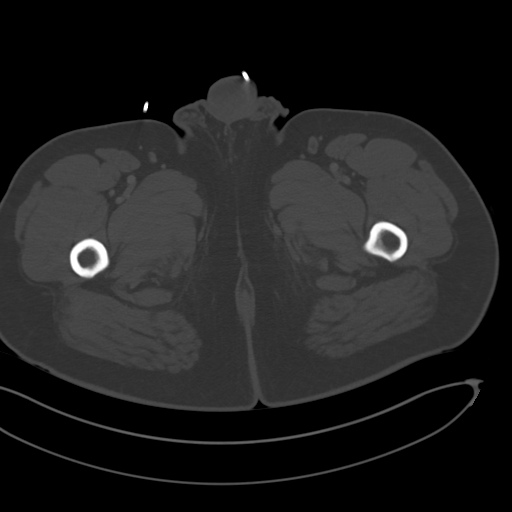
[im 13/101  soft-tissue]
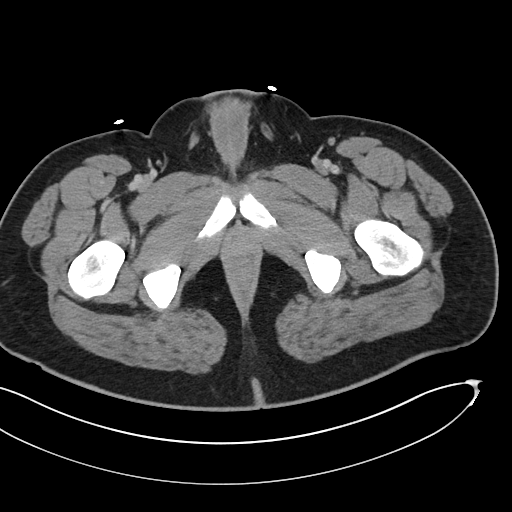
[im 21/101  soft-tissue]
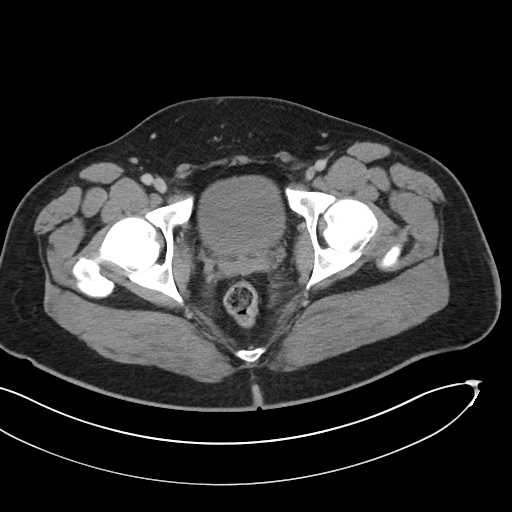
[im 30/101  soft-tissue]
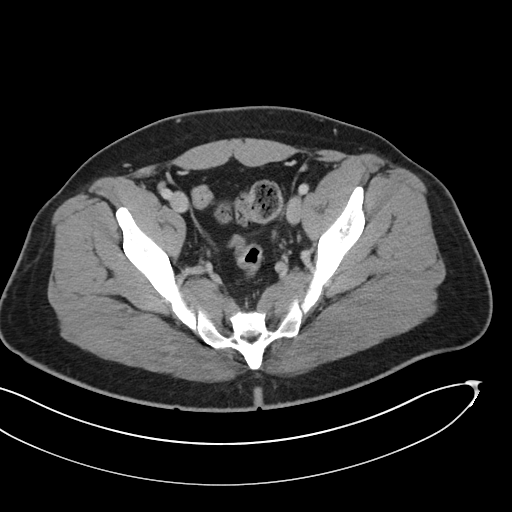
[im 34/101  soft-tissue]
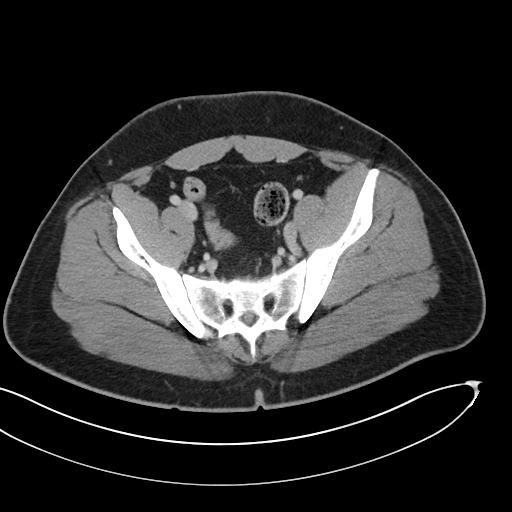
[im 42/101  soft-tissue]
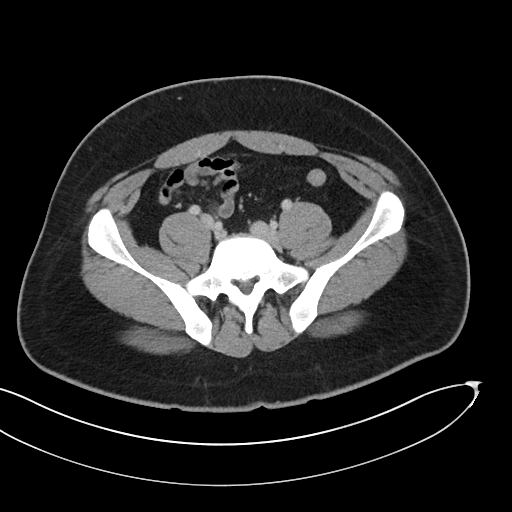
[im 51/101  soft-tissue]
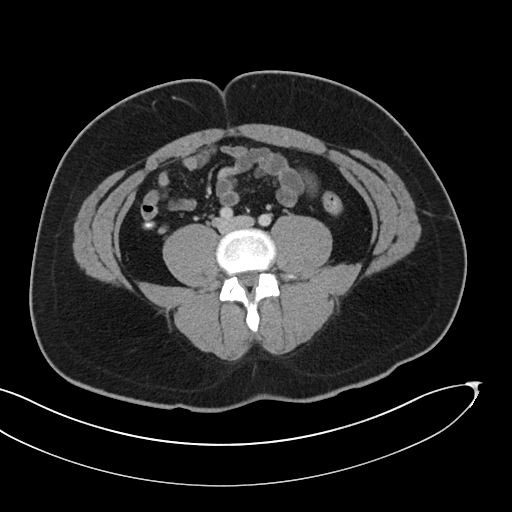
[im 59/101  soft-tissue]
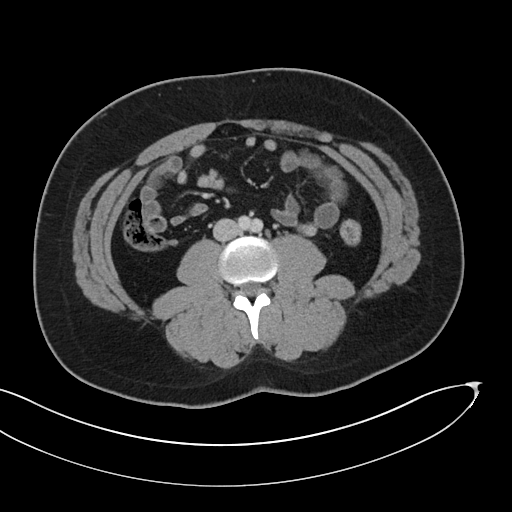
[im 67/101  soft-tissue]
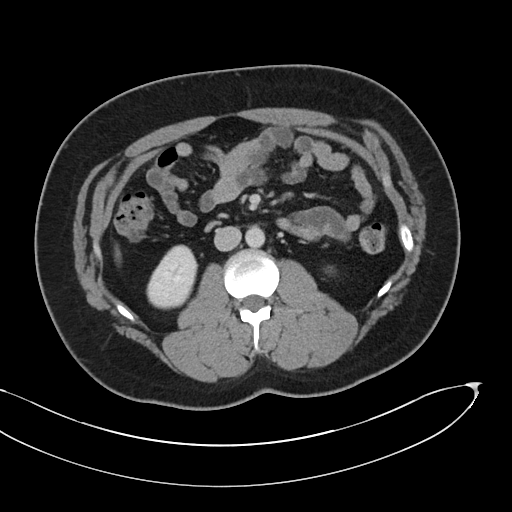
[im 67/101  bone]
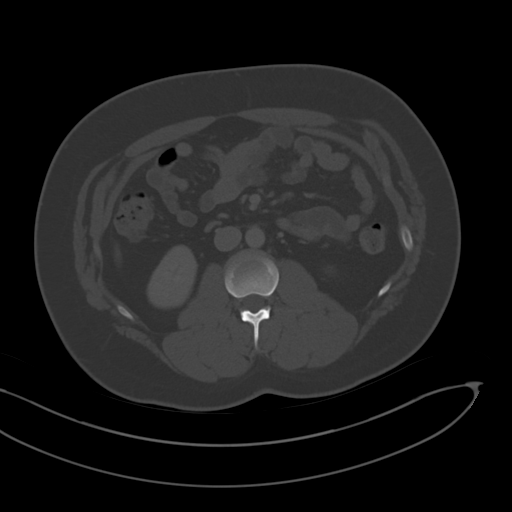
[im 71/101  soft-tissue]
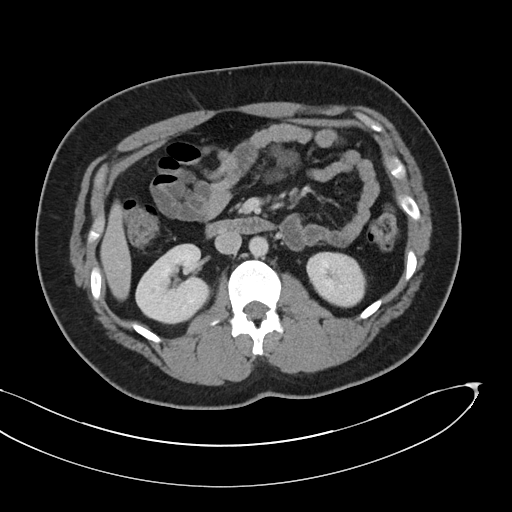
[im 80/101  soft-tissue]
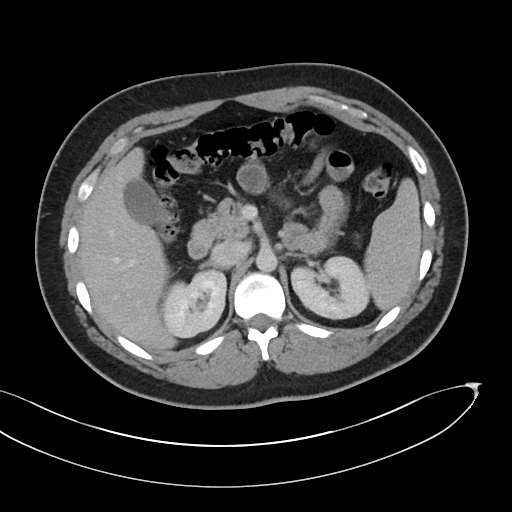
[im 88/101  soft-tissue]
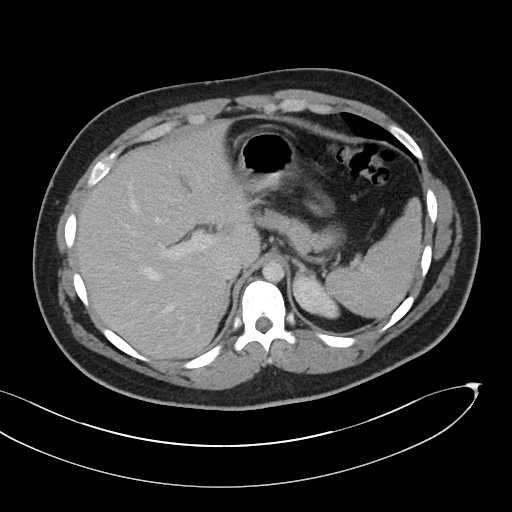
[im 96/101  soft-tissue]
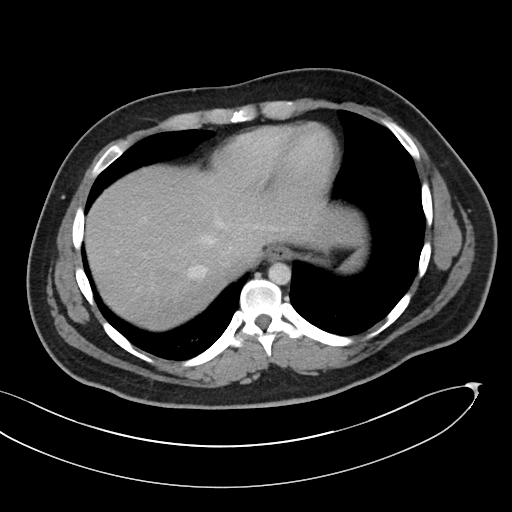

[Series 5: coronal · coronal · 0.86mm/px · 3 of 99 slices shown]
[im 33/99  soft-tissue]
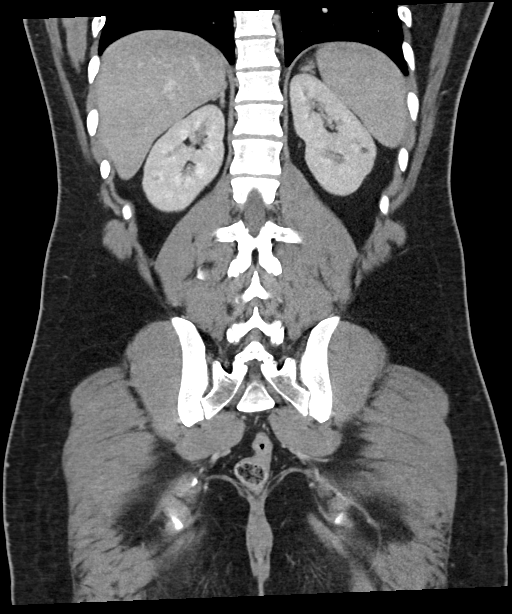
[im 44/99  soft-tissue]
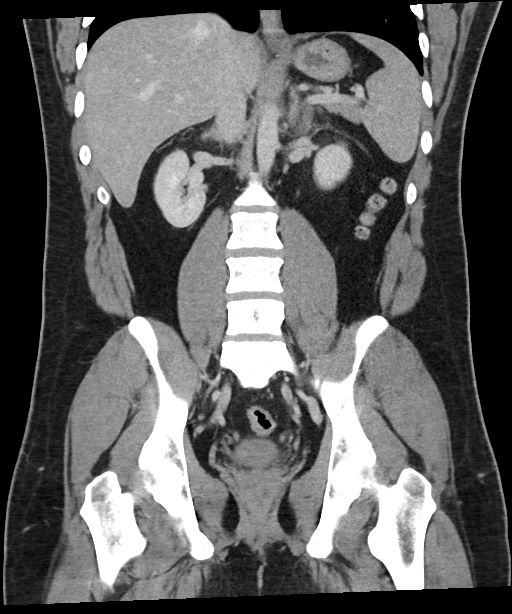
[im 55/99  soft-tissue]
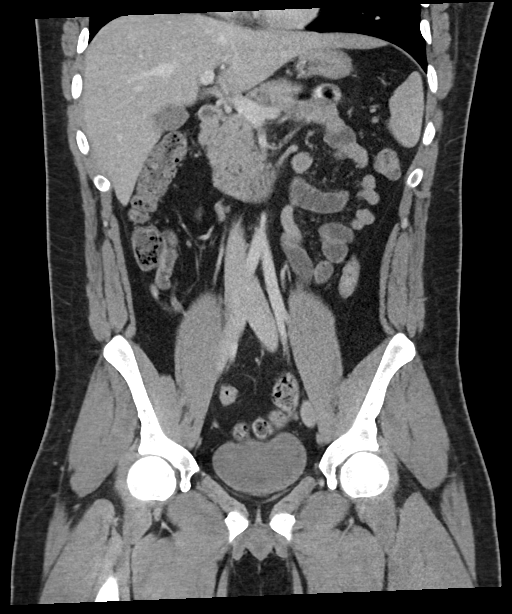

[16 of 46 positions shown; findings below may reference images not displayed]

RADIATION DOSE REDUCTION: This exam was performed according to the
departmental dose-optimization program which includes automated
exposure control, adjustment of the mA and/or kV according to
patient size and/or use of iterative reconstruction technique.

CONTRAST:  100mL OMNIPAQUE IOHEXOL 300 MG/ML  SOLN
FINDINGS: Lower chest: Negative; mild costophrenic angle atelectasis is less
pronounced than in 6360.

Hepatobiliary: Tiny benign cyst at the lateral liver dome on series
2, image 4 is stable since 6360. Otherwise negative liver and
gallbladder.

Pancreas: Negative.

Spleen: Negative.

Adrenals/Urinary Tract: Normal adrenal glands. Nonobstructed kidneys
with symmetric renal enhancement. No nephrolithiasis or pararenal
inflammation. Negative ureters and bladder.

Stomach/Bowel: Stable and normal appendix series 2, image 45. mostly
decompressed large and small bowel throughout the abdomen and
pelvis. Stomach and duodenum appear negative. No free air, free
fluid, mesenteric inflammation.

Vascular/Lymphatic: Major arterial structures appear patent and
normal. Patent portal venous system. No lymphadenopathy.

Reproductive: Negative.

Other: No pelvic free fluid.

Musculoskeletal: Negative.
IMPRESSION: Negative CT Abdomen and Pelvis. No acute or inflammatory process
identified.

## 2023-10-02 ENCOUNTER — Other Ambulatory Visit: Payer: Self-pay

## 2023-10-02 ENCOUNTER — Emergency Department (HOSPITAL_BASED_OUTPATIENT_CLINIC_OR_DEPARTMENT_OTHER)
Admission: EM | Admit: 2023-10-02 | Discharge: 2023-10-02 | Disposition: A | Discharge status: Home / Self Care | Payer: Self-pay | Attending: Emergency Medicine | Admitting: Emergency Medicine

## 2023-10-02 ENCOUNTER — Encounter (HOSPITAL_BASED_OUTPATIENT_CLINIC_OR_DEPARTMENT_OTHER): Payer: Self-pay | Admitting: Emergency Medicine

## 2023-10-02 DIAGNOSIS — Z711 Person with feared health complaint in whom no diagnosis is made: Secondary | ICD-10-CM

## 2023-10-02 DIAGNOSIS — Z113 Encounter for screening for infections with a predominantly sexual mode of transmission: Secondary | ICD-10-CM | POA: Insufficient documentation

## 2023-10-02 LAB — HIV ANTIBODY (ROUTINE TESTING W REFLEX): HIV Screen 4th Generation wRfx: NONREACTIVE

## 2023-10-02 LAB — RPR: RPR Ser Ql: NONREACTIVE

## 2023-10-02 NOTE — ED Notes (Signed)

## 2023-10-02 NOTE — ED Provider Notes (Signed)
 [place] EMERGENCY DEPARTMENT AT [facility] Provider Note   CSN: [id number] Arrival date & time: 10/02/23  0704     History  Chief Complaint  Patient presents with   SEXUALLY TRANSMITTED DISEASE    Steven Rhodes is a 23 y.o. male.  HPI   Patient has a history of ADHD testicular torsion.  He presents ED with complaints of STI concerns.  Patient initially indicated he was here for checkup however he does report that he had intercourse recently and the condom broke.  He went to make sure he did not have an STD.  He is not having any dysuria.  No hematuria.  No penile discharge no lesions.  No abdominal pain.  No other concerns.  Patient states he just got off work and the urgent care was closed he wanted to get checked out  Home Medications Prior to Admission medications   Medication Sig Start Date End Date Taking? Authorizing Provider  doxycycline  (VIBRAMYCIN ) 100 MG capsule Take 1 capsule (100 mg total) by mouth 2 (two) times daily. 12/17/22   Rancour, Mara Seminole, MD  HYDROcodone -acetaminophen  (NORCO/VICODIN) 5-325 MG tablet Take 1-2 tablets by mouth every 6 (six) hours as needed for severe pain. 01/23/22   Rosealee Concha, MD  hydrOXYzine  (ATARAX ) 25 MG tablet Take 1 tablet (25 mg total) by mouth 3 (three) times daily as needed. 08/28/21   Ajibola, Ene A, NP  ondansetron  (ZOFRAN ) 4 MG tablet Take 1 tablet (4 mg total) by mouth every 6 (six) hours as needed for nausea or vomiting. 08/10/21   Ballard Bongo, MD  sertraline  (ZOLOFT ) 25 MG tablet Take 1 tablet (25 mg total) by mouth daily. 08/28/21 08/28/22  Ajibola, Alexandria Ida A, NP  SUMAtriptan  (IMITREX ) 50 MG tablet Take 1 tablet (50 mg total) by mouth every 2 (two) hours as needed for migraine. May repeat in 2 hours if headache persists or recurs. 12/01/20   Reena Canning, NP      Allergies    Patient has no known allergies.    Review of Systems   Review of Systems  Physical Exam Updated Vital Signs BP 135/79 (BP  Location: Right Arm)   Pulse 61   Temp 98.7 F (37.1 C) (Oral)   Resp 16   Ht 1.803 m (5\' 11" )   Wt 102.1 kg   SpO2 100%   BMI 31.38 kg/m  Physical Exam Vitals and nursing note reviewed.  Constitutional:      General: He is not in acute distress.    Appearance: He is well-developed.  HENT:     Head: Normocephalic and atraumatic.     Right Ear: External ear normal.     Left Ear: External ear normal.  Eyes:     General: No scleral icterus.       Right eye: No discharge.        Left eye: No discharge.     Conjunctiva/sclera: Conjunctivae normal.  Neck:     Trachea: No tracheal deviation.  Cardiovascular:     Rate and Rhythm: Normal rate.  Pulmonary:     Effort: Pulmonary effort is normal. No respiratory distress.     Breath sounds: No stridor.  Abdominal:     General: There is no distension.  Musculoskeletal:        General: No swelling or deformity.     Cervical back: Neck supple.  Skin:    General: Skin is warm and dry.     Findings: No rash.  Neurological:  Mental Status: He is alert. Mental status is at baseline.     Cranial Nerves: No dysarthria or facial asymmetry.     Motor: No seizure activity.     ED Results / Procedures / Treatments   Labs (all labs ordered are listed, but only abnormal results are displayed) Labs Reviewed  RPR  HIV ANTIBODY (ROUTINE TESTING W REFLEX)  GC/CHLAMYDIA PROBE AMP ([place]) NOT AT [facility]    EKG None  Radiology No results found.  Procedures Procedures    Medications Ordered in ED Medications - No data to display  ED Course/ Medical Decision Making/ A&P                                 Medical Decision Making Amount and/or Complexity of Data Reviewed Labs: ordered.    Patient with concern of possible STI exposure.  Will go ahead and do GC chlamydia on his urine and send off RPR HIV.  Recommend outpatient follow-up with PCP for routine health checkup       Final Clinical Impression(s) / ED  Diagnoses Final diagnoses:  Concern about STI in male without diagnosis    Rx / DC Orders ED Discharge Orders     None         Trish Furl, MD 10/02/23 1558

## 2023-10-02 NOTE — Discharge Instructions (Signed)
 Consider setting up appointment with a primary care doctor for routine checkup.  The test results should show up in your MyChart within the next week.

## 2023-10-03 LAB — GC/CHLAMYDIA PROBE AMP (~~LOC~~) NOT AT ARMC
Chlamydia: NEGATIVE
Comment: NEGATIVE
Comment: NORMAL
Neisseria Gonorrhea: NEGATIVE

## 2023-10-05 IMAGING — DX DG HAND COMPLETE 3+V*L*
3 series · 3 of 3 positions shown · non-contrast
Comparison: None.

CLINICAL DATA: Right hand laceration

EXAM:
LEFT HAND - COMPLETE 3+ VIEW

[hand ap]
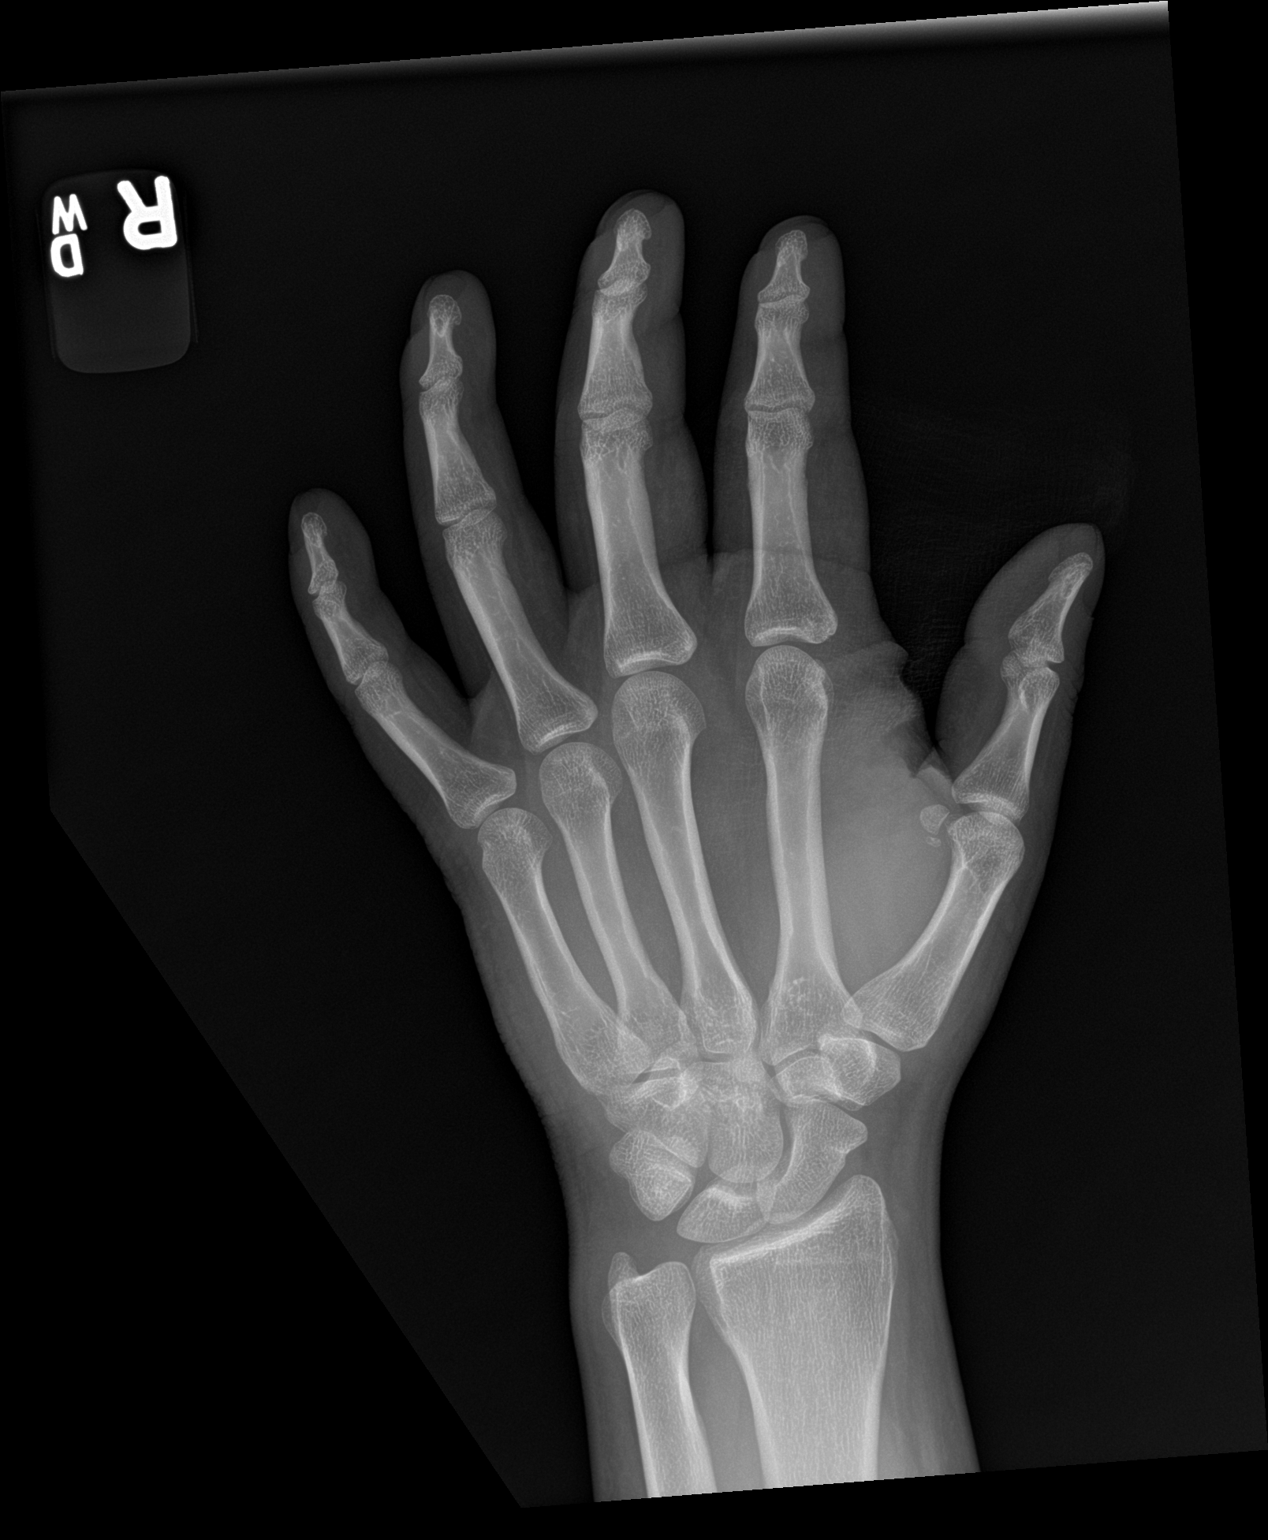

[hand obl]
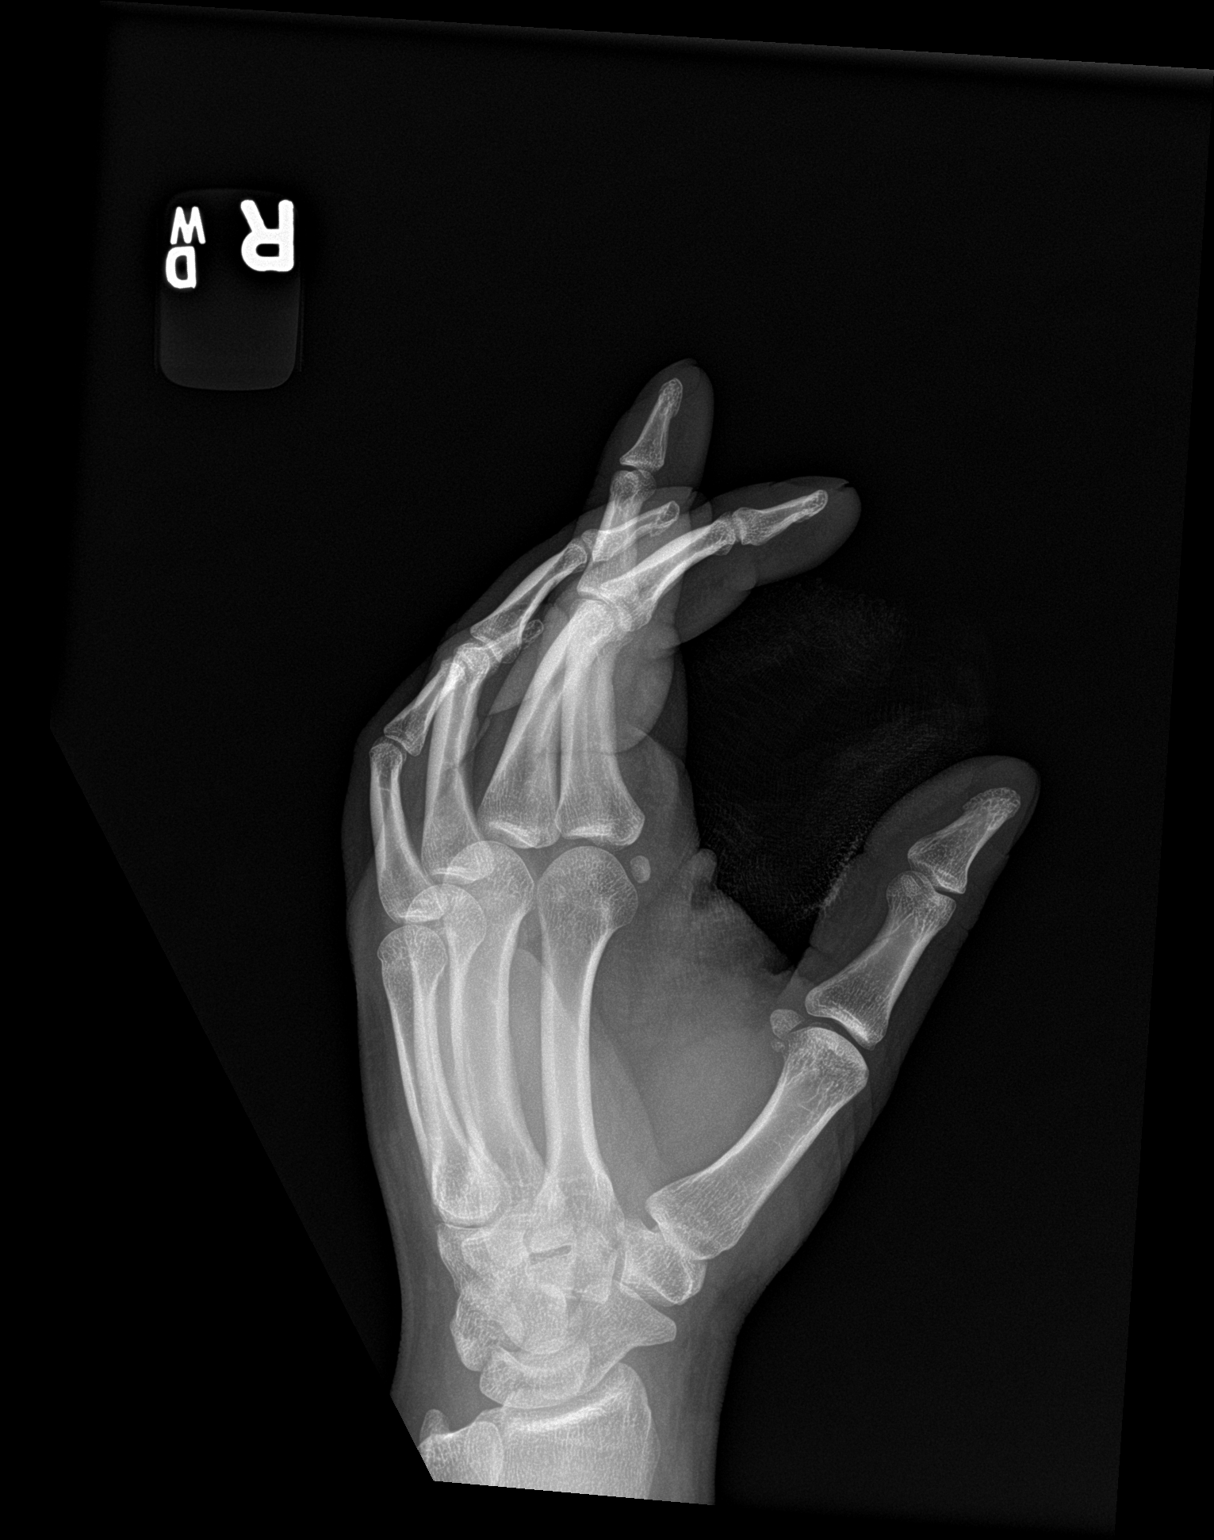

[hand lat]
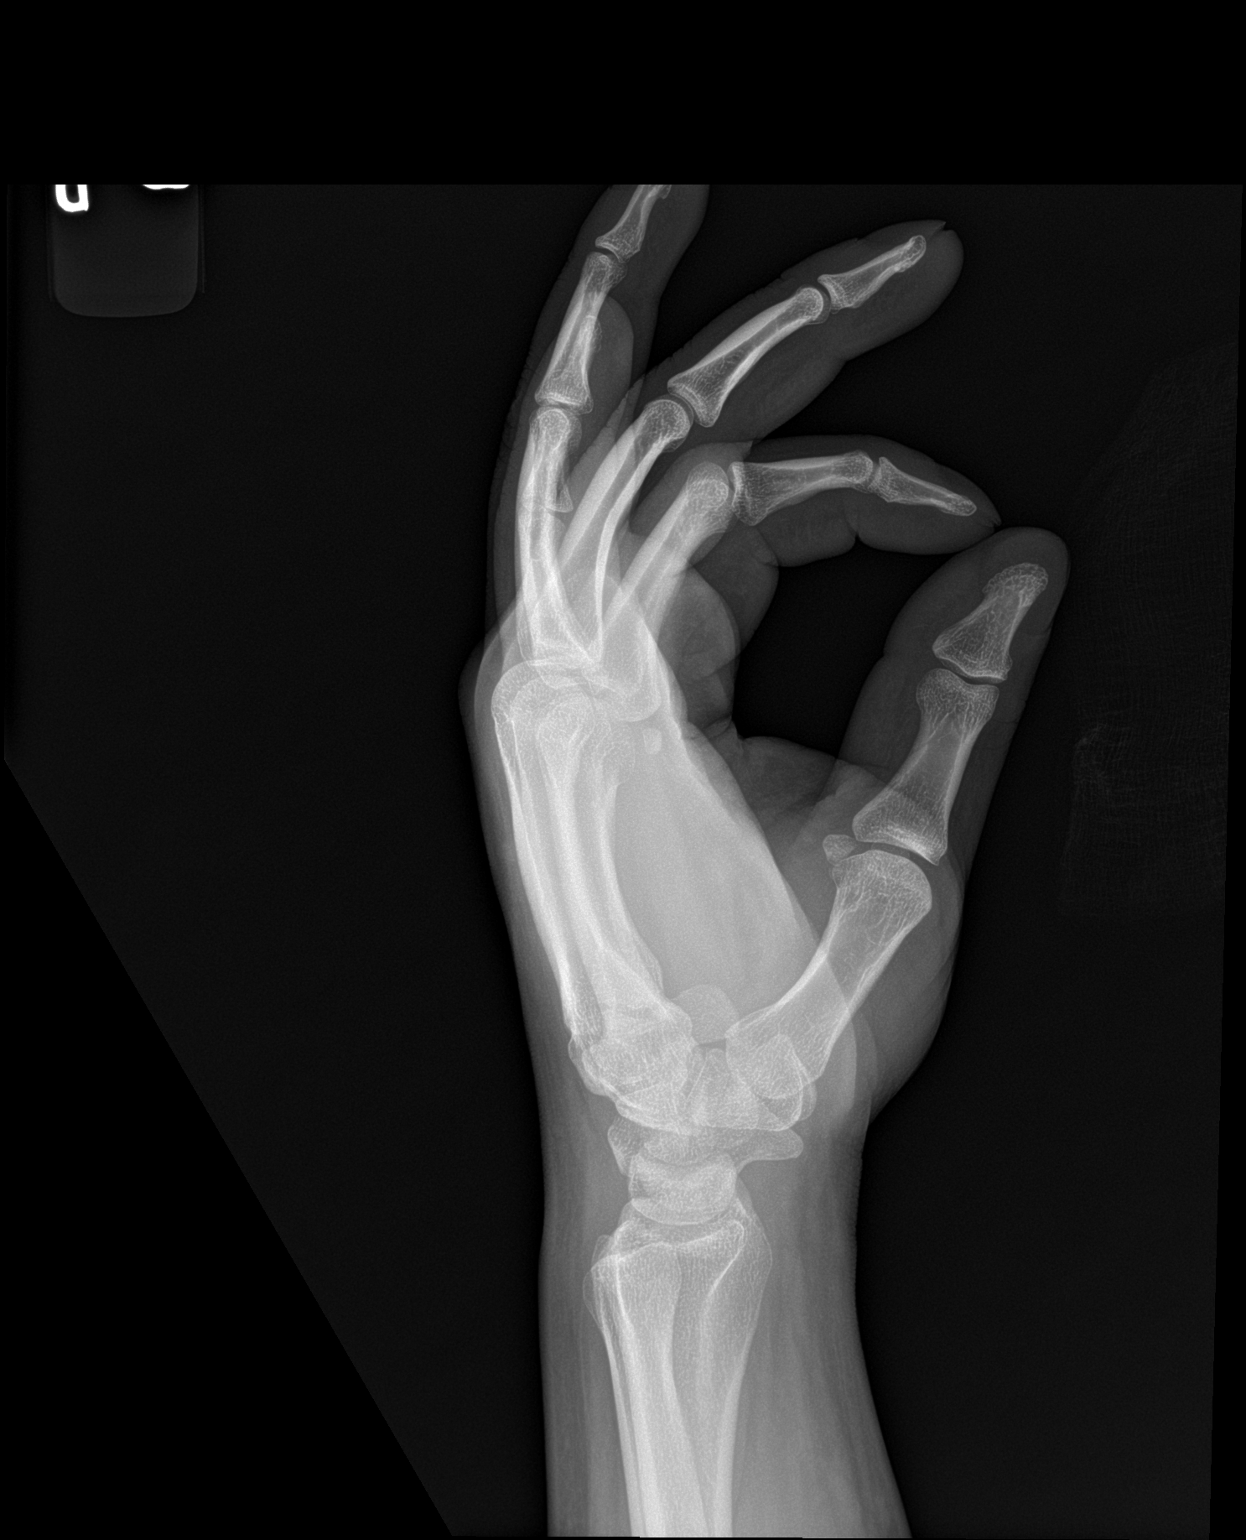

[3 of 3 positions shown; findings below may reference images not displayed]

FINDINGS: There is no evidence of fracture or dislocation. There is no
evidence of arthropathy or other focal bone abnormality. A 10 mm x 7
mm superficial soft tissue laceration is seen along the region of
the thenar eminence.
IMPRESSION: Soft tissue laceration along the region of the thenar eminence
without evidence of an acute osseous abnormality.

## 2023-11-16 ENCOUNTER — Emergency Department (HOSPITAL_BASED_OUTPATIENT_CLINIC_OR_DEPARTMENT_OTHER)
Admission: EM | Admit: 2023-11-16 | Discharge: 2023-11-16 | Disposition: A | Discharge status: Home / Self Care | Payer: Self-pay

## 2023-11-16 ENCOUNTER — Other Ambulatory Visit: Payer: Self-pay

## 2023-11-16 ENCOUNTER — Encounter (HOSPITAL_BASED_OUTPATIENT_CLINIC_OR_DEPARTMENT_OTHER): Payer: Self-pay | Admitting: *Deleted

## 2023-11-16 DIAGNOSIS — R1110 Vomiting, unspecified: Secondary | ICD-10-CM | POA: Insufficient documentation

## 2023-11-16 DIAGNOSIS — R1084 Generalized abdominal pain: Secondary | ICD-10-CM | POA: Insufficient documentation

## 2023-11-16 DIAGNOSIS — R197 Diarrhea, unspecified: Secondary | ICD-10-CM | POA: Insufficient documentation

## 2023-11-16 LAB — CBC
HCT: 43.4 % (ref 39.0–52.0)
Hemoglobin: 14.8 g/dL (ref 13.0–17.0)
MCH: 28.4 pg (ref 26.0–34.0)
MCHC: 34.1 g/dL (ref 30.0–36.0)
MCV: 83.3 fL (ref 80.0–100.0)
Platelets: 300 10*3/uL (ref 150–400)
RBC: 5.21 MIL/uL (ref 4.22–5.81)
RDW: 13.1 % (ref 11.5–15.5)
WBC: 6.9 10*3/uL (ref 4.0–10.5)
nRBC: 0 % (ref 0.0–0.2)

## 2023-11-16 LAB — COMPREHENSIVE METABOLIC PANEL WITH GFR
ALT: 33 U/L (ref 0–44)
AST: 27 U/L (ref 15–41)
Albumin: 4.6 g/dL (ref 3.5–5.0)
Alkaline Phosphatase: 80 U/L (ref 38–126)
Anion gap: 14 (ref 5–15)
BUN: 18 mg/dL (ref 6–20)
CO2: 22 mmol/L (ref 22–32)
Calcium: 10.1 mg/dL (ref 8.9–10.3)
Chloride: 104 mmol/L (ref 98–111)
Creatinine, Ser: 0.98 mg/dL (ref 0.61–1.24)
GFR, Estimated: >60 mL/min (ref >60)
Glucose, Bld: 99 mg/dL (ref 70–99)
Potassium: 4.1 mmol/L (ref 3.5–5.1)
Sodium: 139 mmol/L (ref 135–145)
Total Bilirubin: 0.4 mg/dL (ref 0.0–1.2)
Total Protein: 8 g/dL (ref 6.5–8.1)

## 2023-11-16 LAB — LIPASE, BLOOD: Lipase: 32 U/L (ref 11–51)

## 2023-11-16 MED ORDER — LOPERAMIDE HCL 2 MG PO CAPS: 4.0000 mg | ORAL_CAPSULE | Freq: Once | ORAL | 0 refills | Status: AC

## 2023-11-16 MED ORDER — AZITHROMYCIN 250 MG PO TABS
1000.0000 mg | ORAL_TABLET | Freq: Once | ORAL | Status: AC
  Administered 2023-11-16: 1000 mg via ORAL
  Filled 2023-11-16: qty 4

## 2023-11-16 MED ORDER — ONDANSETRON HCL 4 MG/2ML IJ SOLN
4.0000 mg | Freq: Once | INTRAMUSCULAR | Status: AC
  Administered 2023-11-16: 4 mg via INTRAVENOUS
  Filled 2023-11-16: qty 2

## 2023-11-16 MED ORDER — ONDANSETRON 4 MG PO TBDP: 4.0000 mg | ORAL_TABLET | Freq: Three times a day (TID) | ORAL | 0 refills | Status: DC | PRN

## 2023-11-16 NOTE — Discharge Instructions (Addendum)
 Your workup is reassuring today.  Your kidney, liver, and pancreas labs are normal.  Your electrolytes are normal.  Your blood counts are normal.  Given your symptoms started after eating expired beef, you may have been exposed to a pathogen that is causing your diarrhea.  You were given a dose of antibiotic here today (azithromycin ) to help with the diarrhea.  If you continue to experience diarrhea at home, you may take the prescribed loperamide as directed.  You have been prescribed Zofran  (ondansetron ) for nausea and vomiting. You may take this every 8 hours as needed for nausea and vomiting. This medication dissolves under the tongue. You do not need to swallow it.   Please establish care with a primary care provider for further routine care.  I have included office information above.  Please return to the emergency room if you continue to have severe diarrhea, worsening abdominal pain, fevers, persistent vomiting, any other new or concerning symptoms.

## 2023-11-16 NOTE — ED Provider Notes (Signed)
 [place] EMERGENCY DEPARTMENT AT [facility] Provider Note   CSN: [id number] Arrival date & time: 11/16/23  1617     Patient presents with: Abdominal Pain   Steven Rhodes is a 23 y.o. male with no pertinent past medical history presents with concern for crampy abdominal pain ongoing for the past 2 weeks.  He has also had about 4 episodes of nonbloody diarrhea daily for the past 2 weeks.  Diarrhea appears yellow.  Reports some intermittent episodes of nonbloody emesis as well.  Denies any fever or chills.  He reports eating a package of expired ground beef 2 weeks ago, and his symptoms seem to start after this.  Denies any recent travel.  Denies any recent antibiotic use.    Abdominal Pain      Prior to Admission medications   Medication Sig Start Date End Date Taking? Authorizing Provider  loperamide (IMODIUM) 2 MG capsule Take 2 capsules (4 mg total) by mouth once for 1 dose. As needed for diarrhea 11/16/23 11/16/23 Yes Veta Palma, PA-C  ondansetron  (ZOFRAN -ODT) 4 MG disintegrating tablet Take 1 tablet (4 mg total) by mouth every 8 (eight) hours as needed for nausea or vomiting. 11/16/23  Yes Veta Palma, PA-C  doxycycline  (VIBRAMYCIN ) 100 MG capsule Take 1 capsule (100 mg total) by mouth 2 (two) times daily. 12/17/22   Rancour, Garnette, MD  HYDROcodone -acetaminophen  (NORCO/VICODIN) 5-325 MG tablet Take 1-2 tablets by mouth every 6 (six) hours as needed for severe pain. 01/23/22   Jerrol Agent, MD  hydrOXYzine  (ATARAX ) 25 MG tablet Take 1 tablet (25 mg total) by mouth 3 (three) times daily as needed. 08/28/21   Ajibola, Ene A, NP  sertraline  (ZOLOFT ) 25 MG tablet Take 1 tablet (25 mg total) by mouth daily. 08/28/21 08/28/22  Ajibola, Kathryne A, NP  SUMAtriptan  (IMITREX ) 50 MG tablet Take 1 tablet (50 mg total) by mouth every 2 (two) hours as needed for migraine. May repeat in 2 hours if headache persists or recurs. 12/01/20   Teresa Shelba SAUNDERS, NP    Allergies:  Patient has no known allergies.    Review of Systems  Gastrointestinal:  Positive for abdominal pain.    Updated Vital Signs BP 137/71 (BP Location: Left Arm)   Pulse 77   Temp 98.6 F (37 C) (Oral)   Resp 14   SpO2 99%   Physical Exam Vitals and nursing note reviewed.  Constitutional:      General: He is not in acute distress.    Appearance: He is well-developed.     Comments: No active vomiting  HENT:     Head: Normocephalic and atraumatic.   Eyes:     Conjunctiva/sclera: Conjunctivae normal.    Cardiovascular:     Rate and Rhythm: Normal rate and regular rhythm.     Heart sounds: No murmur heard. Pulmonary:     Effort: Pulmonary effort is normal. No respiratory distress.     Breath sounds: Normal breath sounds.  Abdominal:     Palpations: Abdomen is soft.     Tenderness: There is no abdominal tenderness.     Comments: Abdomen soft nontender, no rebound or guarding   Musculoskeletal:        General: No swelling.     Cervical back: Neck supple.   Skin:    General: Skin is warm and dry.     Capillary Refill: Capillary refill takes less than 2 seconds.   Neurological:     Mental Status: He is alert.  Psychiatric:        Mood and Affect: Mood normal.     (all labs ordered are listed, but only abnormal results are displayed) Labs Reviewed  GASTROINTESTINAL PANEL BY PCR, STOOL (REPLACES STOOL CULTURE)  LIPASE, BLOOD  COMPREHENSIVE METABOLIC PANEL WITH GFR  CBC  URINALYSIS, ROUTINE W REFLEX MICROSCOPIC    EKG: None  Radiology: No results found.   Procedures   Medications Ordered in the ED  azithromycin  (ZITHROMAX ) tablet 1,000 mg (has no administration in time range)  ondansetron  (ZOFRAN ) injection 4 mg (4 mg Intravenous Given 11/16/23 1724)                                    Medical Decision Making Amount and/or Complexity of Data Reviewed Labs: ordered.  Risk Prescription drug management.     Differential diagnosis includes but is  not limited to Cholelithiasis, cholangitis, choledocholithiasis, peptic ulcer, gastritis, gastroenteritis, appendicitis, IBS, IBD, DKA, nephrolithiasis, UTI, pyelonephritis, pancreatitis, diverticulitis   ED Course:  Upon initial evaluation, patient is well-appearing, no acute distress.  Stable vitals.  Reporting crampy abdominal pain for the past 2 weeks.  Abdomen is soft and nontender on exam.  Also reporting some intermittent episodes of vomiting, but no active vomiting currently.  Unable to provide stool sample at this time  Labs Ordered: I Ordered, and personally interpreted labs.  The pertinent results include:   CBC, CMP, and lipase all within normal limits Stool testing ordered patient unable to provide sample  Medications Given: Zofran  Azithromycin   Upon re-evaluation, patient remains well-appearing with no worsening abdominal pain.  No vomiting here, able to tolerate p.o. intake.  Given no significant abdominal tenderness palpation, no fever or tachycardia, and CBC, CMP, and lipase all within normal limits, I have low concern for acute intra-abdominal pathology.  Most concern for a gastroenteritis secondary to his expired beef consumption.  Diarrhea is nonbloody, no fevers, no leukocytosis, no travel outside the country, have low concern for etiology to his diarrhea at this time that would warrant inpatient admission.  Will treat for potential food borne diarrheal illness with azithromycin  given symptoms have been ongoing for 2 weeks.  Stable and appropriate for discharge home at this time  Impression: Diarrhea  Disposition:  The patient was discharged home with instructions to Take loperamide as directed if diarrhea not improved after azithromycin .  Zofran  as needed for nausea. Return precautions given.    This chart was dictated using voice recognition software, Dragon. Despite the best efforts of this provider to proofread and correct errors, errors may still occur which  can change documentation meaning.       Final diagnoses:  Diarrhea, unspecified type  Generalized abdominal pain    ED Discharge Orders          Ordered    loperamide (IMODIUM) 2 MG capsule   Once        11/16/23 1834    ondansetron  (ZOFRAN -ODT) 4 MG disintegrating tablet  Every 8 hours PRN        11/16/23 1837               Veta Palma, PA-C 11/16/23 1837    Neysa Caron PARAS, DO 11/16/23 2355

## 2023-11-16 NOTE — ED Notes (Signed)
 Pt d/c instructions, medications, and follow-up care reviewed with pt. Pt verbalized understanding and had no further questions at time of d/c. Pt CA&Ox4, ambulatory, and in NAD at time of d/c. Pt given instructions and supplies to obtain stool sample at home if diarrhea persists and pt must return to ER. Pt verbalized understanding

## 2023-11-16 NOTE — ED Triage Notes (Addendum)
 Patient to ED POV reporting 2 weeks of abd pain, diarrhea and nausea. Patient ate expired meat 2 weeks ago.   Patient also has a growth in his liver that was 8cm when last check but benign. Patient unsure if symptoms today are related.

## 2023-11-16 NOTE — ED Notes (Signed)
 Pt attempted to provide stool sample, unsuccessful at this time. Aware for need of sample

## 2024-01-13 ENCOUNTER — Other Ambulatory Visit: Payer: Self-pay

## 2024-01-13 ENCOUNTER — Encounter (HOSPITAL_COMMUNITY): Payer: Self-pay

## 2024-01-13 ENCOUNTER — Ambulatory Visit (HOSPITAL_COMMUNITY)
Admission: RE | Admit: 2024-01-13 | Discharge: 2024-01-13 | Disposition: A | Discharge status: Home / Self Care | Payer: Self-pay | Source: Ambulatory Visit | Attending: Internal Medicine | Admitting: Internal Medicine

## 2024-01-13 VITALS — BP 116/80 | HR 83 | Temp 98.9°F | Resp 20

## 2024-01-13 DIAGNOSIS — A084 Viral intestinal infection, unspecified: Secondary | ICD-10-CM

## 2024-01-13 DIAGNOSIS — K219 Gastro-esophageal reflux disease without esophagitis: Secondary | ICD-10-CM

## 2024-01-13 MED ORDER — ALUM & MAG HYDROXIDE-SIMETH 200-200-20 MG/5ML PO SUSP
30.0000 mL | Freq: Once | ORAL | Status: AC
  Administered 2024-01-13: 30 mL via ORAL

## 2024-01-13 MED ORDER — LIDOCAINE VISCOUS HCL 2 % MT SOLN
15.0000 mL | Freq: Once | OROMUCOSAL | Status: AC
  Administered 2024-01-13: 15 mL via OROMUCOSAL

## 2024-01-13 MED ORDER — LIDOCAINE VISCOUS HCL 2 % MT SOLN
OROMUCOSAL | Status: AC
  Filled 2024-01-13: qty 15

## 2024-01-13 MED ORDER — OMEPRAZOLE 20 MG PO CPDR: 20.0000 mg | DELAYED_RELEASE_CAPSULE | Freq: Two times a day (BID) | ORAL | 2 refills | Status: DC

## 2024-01-13 MED ORDER — ALUM & MAG HYDROXIDE-SIMETH 200-200-20 MG/5ML PO SUSP
ORAL | Status: AC
  Filled 2024-01-13: qty 30

## 2024-01-13 NOTE — ED Triage Notes (Signed)
 PT reports he has GERD  and takes tums. Pt reports the GERD has been worse for the last month. Pt says he eats late and eats large portions. Pt reports he vomited last week.  Pt also reports he has had diarrhea.

## 2024-01-13 NOTE — Discharge Instructions (Addendum)
 Symptoms and physical exam findings are consistent with gastroesophageal reflux disease. We have treated this with a combination of Maalox/mylanta and lidocaine . We will send in a prescription for Prilosec 20 mg twice daily. If symptoms worsen significantly, then recommend going to the emergency room. If symptoms remain recurrent but not severe, then recommend seeing a gastroenterologist. We will help arrange an appointment to see a primary care provider to follow up on this. The diarrhea is likely viral in nature and as you are able to eat and stay hydrated, then recommend monitoring this. If you become unable to stay hydrated, then return to urgent care or go the emergency room.

## 2024-01-13 NOTE — ED Provider Notes (Signed)
 [facility]    CSN: [id number] Arrival date & time: 01/13/24  1236      History   Chief Complaint Chief Complaint  Patient presents with   Diarrhea    Entered by patient   Gastroesophageal Reflux    HPI Steven Mehlhoff Muller-Reyes is a 23 y.o. male.   23 y.o. male who presents to urgent care with complaints of acid reflux and diarrhea.  He reports that he started having acid reflux type symptoms for approximately 6 weeks.  He has been taking Tums without much relief.  He reports that the symptoms are worse right before he eats.  He reports he has to eat at night before he goes to bed so that he feels full and this helps reduce the symptoms.  He denies any history of acid reflux or stomach issues in the past.  He reports he has been eating a lot more in the last 2 to 3 months. He has had diarrhea for the last 2 days. It is intermittent and not severe. He is eating and drinking normal. Denies fevers, chills, nausea, vomiting, congestion,    Diarrhea Associated symptoms: no abdominal pain, no arthralgias, no chills, no fever and no vomiting   Gastroesophageal Reflux Pertinent negatives include no chest pain, no abdominal pain and no shortness of breath.    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   Depression    Depression with anxiety 03/04/2017   H/O sexually transmitted disease 08/23/2016   Abdominal pain 06/25/2014   Oppositional defiant behavior 06/18/2011    Tinea corporis 10/02/2010   PREMATURE BIRTH 08/29/2009   VISUAL ACUITY, DECREASED 09/30/2007   Attention deficit hyperactivity disorder (ADHD) 07/25/2006    Past Surgical History:  Procedure Laterality Date   ORCHIOPEXY  05/09/2012   Procedure: ORCHIOPEXY PEDIATRIC;  Surgeon: Alm GORMAN Fragmin, MD;  Location: [facility] OR;  Service: Urology;  Laterality: Bilateral;   SURGERY SCROTAL / TESTICULAR     TESTICLE TORSION REDUCTION  05/09/2012   Procedure: TESTICULAR TORSION REPAIR;  Surgeon: Alm GORMAN Fragmin, MD;  Location: [facility] OR;  Service: Urology;  Laterality: N/A;       Home Medications    Prior to Admission medications   Medication Sig Start Date End Date Taking? Authorizing Provider  omeprazole  (PRILOSEC) 20 MG capsule Take 1 capsule (20 mg total) by mouth 2 (two) times daily before a meal. 01/13/24 04/12/24 Yes Dontee Jaso A, PA-C  doxycycline  (VIBRAMYCIN ) 100 MG capsule Take 1 capsule (100 mg total) by mouth 2 (two) times daily. 12/17/22   Rancour, Garnette, MD  HYDROcodone -acetaminophen  (NORCO/VICODIN) 5-325 MG tablet Take 1-2 tablets by mouth every 6 (six) hours as needed for severe pain. 01/23/22   Jerrol Agent, MD  hydrOXYzine  (ATARAX ) 25 MG tablet Take 1 tablet (25 mg total) by mouth 3 (three) times daily as needed. 08/28/21   Ajibola, Ene A, NP  ondansetron  (ZOFRAN -ODT) 4 MG  disintegrating tablet Take 1 tablet (4 mg total) by mouth every 8 (eight) hours as needed for nausea or vomiting. 11/16/23   Veta Palma, PA-C  sertraline  (ZOLOFT ) 25 MG tablet Take 1 tablet (25 mg total) by mouth daily. 08/28/21 08/28/22  Ajibola, Kathryne A, NP  SUMAtriptan  (IMITREX ) 50 MG tablet Take 1 tablet (50 mg total) by mouth every 2 (two) hours as needed for migraine. May repeat in 2 hours if headache persists or recurs. 12/01/20   Teresa Shelba SAUNDERS, NP    Family History Family History  Problem Relation Age of Onset   Hypertension Mother    Diabetes Mother    Hypertension Father    Diabetes Father    Heart  Problems Father     Social History Social History   Tobacco Use   Smoking status: Never   Smokeless tobacco: Never  Vaping Use   Vaping status: Every Day  Substance Use Topics   Alcohol use: Yes    Comment: rarely   Drug use: Never     Allergies   Patient has no known allergies.   Review of Systems Review of Systems  Constitutional:  Negative for chills and fever.  HENT:  Negative for ear pain and sore throat.   Eyes:  Negative for pain and visual disturbance.  Respiratory:  Negative for cough and shortness of breath.   Cardiovascular:  Negative for chest pain and palpitations.  Gastrointestinal:  Positive for diarrhea. Negative for abdominal pain and vomiting.       Acid reflux type burning in the stomach before eating  Genitourinary:  Negative for dysuria and hematuria.  Musculoskeletal:  Negative for arthralgias and back pain.  Skin:  Negative for color change and rash.  Neurological:  Negative for seizures and syncope.  All other systems reviewed and are negative.    Physical Exam Triage Vital Signs ED Triage Vitals  Encounter Vitals Group     BP 01/13/24 1313 116/80     Girls Systolic BP Percentile --      Girls Diastolic BP Percentile --      Boys Systolic BP Percentile --      Boys Diastolic BP Percentile --      Pulse Rate 01/13/24 1313 83     Resp 01/13/24 1313 20     Temp 01/13/24 1313 98.9 F (37.2 C)     Temp src --      SpO2 01/13/24 1313 95 %     Weight --      Height --      Head Circumference --      Peak Flow --      Pain Score 01/13/24 1310 7     Pain Loc --      Pain Education --      Exclude from Growth Chart --    No data found.  Updated Vital Signs BP 116/80   Pulse 83   Temp 98.9 F (37.2 C)   Resp 20   SpO2 95%   Visual Acuity Right Eye Distance:   Left Eye Distance:   Bilateral Distance:    Right Eye Near:   Left Eye Near:    Bilateral Near:     Physical Exam Vitals and nursing note reviewed.  Constitutional:       General: He is not in acute distress.    Appearance: He is well-developed.  HENT:     Head: Normocephalic and atraumatic.  Eyes:     Conjunctiva/sclera: Conjunctivae normal.  Cardiovascular:     Rate and Rhythm: Normal rate and regular rhythm.     Heart sounds: No murmur heard. Pulmonary:     Effort: Pulmonary effort is normal. No respiratory distress.     Breath sounds: Normal breath sounds.  Abdominal:     General: Bowel sounds are normal. There is no distension.     Palpations: Abdomen is soft. There is no hepatomegaly or mass.     Tenderness: There is abdominal tenderness (mild) in the epigastric area. There is no right CVA tenderness, left CVA tenderness, guarding or rebound. Negative signs include Murphy's sign and McBurney's sign.     Hernia: No hernia is present.  Musculoskeletal:        General: No swelling.     Cervical back: Neck supple.  Skin:    General: Skin is warm and dry.     Capillary Refill: Capillary refill takes less than 2 seconds.  Neurological:     General: No focal deficit present.     Mental Status: He is alert.  Psychiatric:        Mood and Affect: Mood normal.      UC Treatments / Results  Labs (all labs ordered are listed, but only abnormal results are displayed) Labs Reviewed - No data to display  EKG   Radiology No results found.  Procedures Procedures (including critical care time)  Medications Ordered in UC Medications  alum & mag hydroxide-simeth (MAALOX/MYLANTA) 200-200-20 MG/5ML suspension 30 mL (30 mLs Oral Given 01/13/24 1403)  lidocaine  (XYLOCAINE ) 2 % viscous mouth solution 15 mL (15 mLs Mouth/Throat Given 01/13/24 1403)    Initial Impression / Assessment and Plan / UC Course  I have reviewed the triage vital signs and the nursing notes.  Pertinent labs & imaging results that were available during my care of the patient were reviewed by me and considered in my medical decision making (see chart for details).      Viral gastroenteritis  Gastroesophageal reflux disease without esophagitis   Symptoms and physical exam findings are consistent with gastroesophageal reflux disease. We have treated this with a combination of Maalox/mylanta and lidocaine . We will send in a prescription for Prilosec 20 mg twice daily. If symptoms worsen significantly, then recommend going to the emergency room. If symptoms remain recurrent but not severe, then recommend seeing a gastroenterologist. We will help arrange an appointment to see a primary care provider to follow up on this. The diarrhea is likely viral in nature and as you are able to eat and stay hydrated, then recommend monitoring this. If you become unable to stay hydrated, then return to urgent care or go the emergency room.   Final Clinical Impressions(s) / UC Diagnoses   Final diagnoses:  Viral gastroenteritis  Gastroesophageal reflux disease without esophagitis     Discharge Instructions      Symptoms and physical exam findings are consistent with gastroesophageal reflux disease. We have treated this with a combination of Maalox/mylanta and lidocaine . We will send in a prescription for Prilosec 20 mg twice daily. If symptoms worsen significantly, then recommend going to the emergency room. If symptoms remain recurrent but not severe, then recommend seeing a gastroenterologist. We will help arrange an appointment to see a primary care provider to follow up on this. The diarrhea is likely viral in nature and as you are able to eat and stay hydrated, then recommend monitoring this. If you become unable to stay hydrated, then return to urgent care or go the  emergency room.     ED Prescriptions     Medication Sig Dispense Auth. Provider   omeprazole  (PRILOSEC) 20 MG capsule Take 1 capsule (20 mg total) by mouth 2 (two) times daily before a meal. 60 capsule Dominico Rod A, PA-C      PDMP not reviewed this encounter.   Teresa Almarie LABOR,  [place] 01/13/24 1414

## 2024-01-26 ENCOUNTER — Emergency Department (HOSPITAL_BASED_OUTPATIENT_CLINIC_OR_DEPARTMENT_OTHER)
Admission: EM | Admit: 2024-01-26 | Discharge: 2024-01-26 | Disposition: A | Discharge status: Home / Self Care | Payer: Self-pay | Attending: Emergency Medicine | Admitting: Emergency Medicine

## 2024-01-26 ENCOUNTER — Other Ambulatory Visit: Payer: Self-pay

## 2024-01-26 ENCOUNTER — Encounter (HOSPITAL_BASED_OUTPATIENT_CLINIC_OR_DEPARTMENT_OTHER): Payer: Self-pay

## 2024-01-26 DIAGNOSIS — Z202 Contact with and (suspected) exposure to infections with a predominantly sexual mode of transmission: Secondary | ICD-10-CM | POA: Insufficient documentation

## 2024-01-26 DIAGNOSIS — Z711 Person with feared health complaint in whom no diagnosis is made: Secondary | ICD-10-CM

## 2024-01-26 LAB — URINALYSIS, ROUTINE W REFLEX MICROSCOPIC
Bilirubin Urine: NEGATIVE
Glucose, UA: NEGATIVE mg/dL
Hgb urine dipstick: NEGATIVE
Ketones, ur: NEGATIVE mg/dL
Leukocytes,Ua: NEGATIVE
Nitrite: NEGATIVE
Protein, ur: NEGATIVE mg/dL
Specific Gravity, Urine: 1.021 (ref 1.005–1.030)
pH: 6.5 (ref 5.0–8.0)

## 2024-01-26 LAB — HIV ANTIBODY (ROUTINE TESTING W REFLEX): HIV Screen 4th Generation wRfx: NONREACTIVE

## 2024-01-26 MED ORDER — CEFTRIAXONE SODIUM 500 MG IJ SOLR
500.0000 mg | Freq: Once | INTRAMUSCULAR | Status: AC
  Administered 2024-01-26: 500 mg via INTRAMUSCULAR
  Filled 2024-01-26: qty 500

## 2024-01-26 MED ORDER — LIDOCAINE HCL (PF) 1 % IJ SOLN
INTRAMUSCULAR | Status: AC
  Administered 2024-01-26: 5 mL
  Filled 2024-01-26: qty 5

## 2024-01-26 NOTE — ED Notes (Signed)
 Per physician's order, the urine sample brought from home by the patient was discarded.

## 2024-01-26 NOTE — ED Triage Notes (Signed)
 Pt reports STD check due to girlfriend being diagnosed with Mycoplasma genitalium. Pt reports he was last with ex girlfriend several months ago. Pt denies any STI S/S.

## 2024-01-26 NOTE — ED Provider Notes (Signed)
 [place] EMERGENCY DEPARTMENT AT [facility] Provider Note   CSN: [id number] Arrival date & time: 01/26/24  1527     Patient presents with: Exposure to STD   [facility] is a 23 y.o. male.   The history is provided by the patient and medical records. No language interpreter was used.  Exposure to STD     23 year old male presenting with concerns of STI exposure.  Patient states his girlfriend recently notified to him that she test positive for mycoplasma genitalium.  She finds out yesterday.  Patient says last sexual intercourse was a week ago.  Patient denies having any active symptoms including no urinary discomfort no penile discharge or rash.  He does have remote history of chlamydia infection.  He thought he may have had exposure to it from his ex-girlfriend several months ago.  Prior to Admission medications   Medication Sig Start Date End Date Taking? Authorizing Provider  doxycycline  (VIBRAMYCIN ) 100 MG capsule Take 1 capsule (100 mg total) by mouth 2 (two) times daily. 12/17/22   Rancour, Garnette, MD  HYDROcodone -acetaminophen  (NORCO/VICODIN) 5-325 MG tablet Take 1-2 tablets by mouth every 6 (six) hours as needed for severe pain. 01/23/22   Jerrol Agent, MD  hydrOXYzine  (ATARAX ) 25 MG tablet Take 1 tablet (25 mg total) by mouth 3 (three) times daily as needed. 08/28/21   Mardi Redhead A, NP  omeprazole  (PRILOSEC) 20 MG capsule Take 1 capsule (20 mg total) by mouth 2 (two) times daily before a meal. 01/13/24 04/12/24  White, Almarie LABOR, PA-C  ondansetron  (ZOFRAN -ODT) 4 MG disintegrating tablet Take 1 tablet (4 mg total) by mouth every 8 (eight) hours as needed for nausea or vomiting. 11/16/23   Veta Palma, PA-C  sertraline  (ZOLOFT ) 25 MG tablet Take 1 tablet (25 mg total) by mouth daily. 08/28/21 08/28/22  Ajibola, Redhead A, NP  SUMAtriptan  (IMITREX ) 50 MG tablet Take 1 tablet (50 mg total) by mouth every 2 (two) hours as needed for migraine. May repeat in 2  hours if headache persists or recurs. 12/01/20   Teresa Shelba SAUNDERS, NP    Allergies: Patient has no known allergies.    Review of Systems  Constitutional:  Negative for fever.  Genitourinary:  Negative for difficulty urinating, dysuria, genital sores, penile discharge, penile swelling, scrotal swelling and testicular pain.  Skin:  Negative for wound.    Updated Vital Signs BP 131/78 (BP Location: Right Arm)   Pulse (!) 59   Temp 98.4 F (36.9 C) (Oral)   Resp 18   Ht 5' 11 (1.803 m)   Wt 104.3 kg   SpO2 100%   BMI 32.08 kg/m   Physical Exam Constitutional:      General: He is not in acute distress.    Appearance: He is well-developed.  HENT:     Head: Atraumatic.  Eyes:     Conjunctiva/sclera: Conjunctivae normal.  Abdominal:     Palpations: Abdomen is soft.     Tenderness: There is no abdominal tenderness.  Genitourinary:    Comments: Chaperone present during exam.  No inguinal lymphadenopathy or inguinal hernia noted.  Normal uncircumcised penis without any concerning rash.  Normal scrotum testicles with normal lie Musculoskeletal:     Cervical back: Normal range of motion and neck supple.  Skin:    Findings: No rash.  Neurological:     Mental Status: He is alert.     (all labs ordered are listed, but only abnormal results are displayed) Labs Reviewed  URINALYSIS, ROUTINE W REFLEX MICROSCOPIC  RPR  HIV ANTIBODY (ROUTINE TESTING W REFLEX)  GC/CHLAMYDIA PROBE AMP ([place]) NOT AT [facility]  CERVICOVAGINAL ANCILLARY ONLY    EKG: None  Radiology: No results found.   Procedures   Medications Ordered in the ED  cefTRIAXone  (ROCEPHIN ) injection 500 mg (500 mg Intramuscular Given 01/26/24 1703)  lidocaine  (PF) (XYLOCAINE ) 1 % injection (5 mLs  Given 01/26/24 1704)                                    Medical Decision Making Amount and/or Complexity of Data Reviewed Labs: ordered.   BP 131/78 (BP Location: Right Arm)   Pulse (!) 59   Temp 98.4 F (36.9  C) (Oral)   Resp 18   Ht 5' 11 (1.803 m)   Wt 104.3 kg   SpO2 100%   BMI 32.08 kg/m   85:50 PM  23 year old male presenting with concerns of STI exposure.  Patient states his girlfriend recently notified to him that she test positive for mycoplasma genitalium.  She finds out yesterday.  Patient says last sexual intercourse was a week ago.  Patient denies having any active symptoms including no urinary discomfort no penile discharge or rash.  He does have remote history of chlamydia infection.  He thought he may have had exposure to it from his ex-girlfriend several months ago.  Exam with chaperone present without any concerning rash or discharge noted.  STI screening panel has been sent.  Patient received Rocephin  IM prophylactically for STI.  Patient will be notified if he test positive for specific STI.  He is stable for discharge.     Final diagnoses:  Concern about STI in male without diagnosis    ED Discharge Orders     None          Nivia Colon, PA-C 01/26/24 1720    Lenor Hollering, MD 01/26/24 [phone]

## 2024-01-26 NOTE — Discharge Instructions (Signed)
 We have sent tests to evaluate for potential sexually transmitted infection.  You can check the results through Mychart, link below.  If you test positive for an infection, you will be notify with appropriate treatment.

## 2024-01-27 LAB — RPR: RPR Ser Ql: NONREACTIVE

## 2024-01-28 LAB — GC/CHLAMYDIA PROBE AMP (~~LOC~~) NOT AT ARMC
Chlamydia: NEGATIVE
Comment: NEGATIVE
Comment: NORMAL
Neisseria Gonorrhea: NEGATIVE

## 2024-01-29 LAB — MISC LABCORP TEST (SEND OUT): Labcorp test code: 180086

## 2024-01-30 ENCOUNTER — Telehealth (HOSPITAL_COMMUNITY): Payer: Self-pay

## 2024-01-30 NOTE — Telephone Encounter (Signed)
 Pt. Called an had questions concerning a lab that was drawn on 01/26/2024 and was a send out to [facility].  After reviewing the lab, there is not a final result listed.   I gave the patient the [facility] phone number to contact them, [phone] and also the [phone].  I also informed pt. To call me if he has any other questions or if he was unable to get any information on the lab results.

## 2024-03-30 ENCOUNTER — Ambulatory Visit: Payer: Self-pay

## 2024-04-05 ENCOUNTER — Emergency Department (HOSPITAL_BASED_OUTPATIENT_CLINIC_OR_DEPARTMENT_OTHER)

## 2024-04-05 ENCOUNTER — Emergency Department (HOSPITAL_BASED_OUTPATIENT_CLINIC_OR_DEPARTMENT_OTHER)
Admission: EM | Admit: 2024-04-05 | Discharge: 2024-04-05 | Disposition: A | Discharge status: Home / Self Care | Attending: Emergency Medicine | Admitting: Emergency Medicine

## 2024-04-05 ENCOUNTER — Encounter (HOSPITAL_BASED_OUTPATIENT_CLINIC_OR_DEPARTMENT_OTHER): Payer: Self-pay | Admitting: Emergency Medicine

## 2024-04-05 ENCOUNTER — Other Ambulatory Visit: Payer: Self-pay

## 2024-04-05 DIAGNOSIS — R519 Headache, unspecified: Secondary | ICD-10-CM | POA: Insufficient documentation

## 2024-04-05 MED ORDER — ACETAMINOPHEN 500 MG PO TABS
1000.0000 mg | ORAL_TABLET | Freq: Once | ORAL | Status: AC
  Administered 2024-04-05: 1000 mg via ORAL
  Filled 2024-04-05: qty 2

## 2024-04-05 NOTE — ED Notes (Signed)
 Reviewed AVS/discharge instructions with patient. Time allotted for and all questions answered. Patient is agreeable for d/c and escorted to ED exit by staff.
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
## 2024-04-05 NOTE — Discharge Instructions (Addendum)
 Thank you for visiting the Emergency Department today. It was a pleasure to be part of your healthcare team.  Your test results showed no acute findings. At home, rest, hydrate, resume normal diet. It is important to watch for warning signs such as worsening pain, vision loss, dizziness, syncopal episodes. If any of these happen, return to the Emergency Department or call 911. Thank you for trusting us  with your health.

## 2024-04-10 ENCOUNTER — Ambulatory Visit (HOSPITAL_COMMUNITY): Payer: Self-pay

## 2024-04-14 ENCOUNTER — Emergency Department (HOSPITAL_BASED_OUTPATIENT_CLINIC_OR_DEPARTMENT_OTHER)
Admission: EM | Admit: 2024-04-14 | Discharge: 2024-04-14 | Discharge status: Against Medical Advice | Attending: Emergency Medicine | Admitting: Emergency Medicine

## 2024-04-14 ENCOUNTER — Other Ambulatory Visit: Payer: Self-pay

## 2024-04-14 ENCOUNTER — Encounter (HOSPITAL_BASED_OUTPATIENT_CLINIC_OR_DEPARTMENT_OTHER): Payer: Self-pay

## 2024-04-14 ENCOUNTER — Ambulatory Visit (HOSPITAL_COMMUNITY): Payer: Self-pay

## 2024-04-14 DIAGNOSIS — R112 Nausea with vomiting, unspecified: Secondary | ICD-10-CM | POA: Insufficient documentation

## 2024-04-14 DIAGNOSIS — H538 Other visual disturbances: Secondary | ICD-10-CM | POA: Diagnosis not present

## 2024-04-14 DIAGNOSIS — M545 Low back pain, unspecified: Secondary | ICD-10-CM | POA: Insufficient documentation

## 2024-04-14 DIAGNOSIS — R519 Headache, unspecified: Secondary | ICD-10-CM | POA: Insufficient documentation

## 2024-04-14 DIAGNOSIS — M25552 Pain in left hip: Secondary | ICD-10-CM | POA: Diagnosis not present

## 2024-04-14 DIAGNOSIS — Z5321 Procedure and treatment not carried out due to patient leaving prior to being seen by health care provider: Secondary | ICD-10-CM | POA: Insufficient documentation

## 2024-04-14 DIAGNOSIS — Y92410 Unspecified street and highway as the place of occurrence of the external cause: Secondary | ICD-10-CM | POA: Diagnosis not present
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
## 2024-04-17 ENCOUNTER — Ambulatory Visit (HOSPITAL_COMMUNITY): Payer: Self-pay

## 2024-04-17 ENCOUNTER — Encounter (HOSPITAL_COMMUNITY): Payer: Self-pay

## 2024-04-17 ENCOUNTER — Ambulatory Visit (HOSPITAL_COMMUNITY)
Admission: EM | Admit: 2024-04-17 | Discharge: 2024-04-17 | Disposition: A | Discharge status: Home / Self Care | Payer: Self-pay | Attending: Emergency Medicine | Admitting: Emergency Medicine

## 2024-04-17 DIAGNOSIS — M542 Cervicalgia: Secondary | ICD-10-CM

## 2024-04-17 DIAGNOSIS — R519 Headache, unspecified: Secondary | ICD-10-CM

## 2024-04-17 DIAGNOSIS — M545 Low back pain, unspecified: Secondary | ICD-10-CM

## 2024-04-17 DIAGNOSIS — S161XXA Strain of muscle, fascia and tendon at neck level, initial encounter: Secondary | ICD-10-CM

## 2024-04-17 MED ORDER — DEXAMETHASONE SOD PHOSPHATE PF 10 MG/ML IJ SOLN
10.0000 mg | Freq: Once | INTRAMUSCULAR | Status: AC
  Administered 2024-04-17: 10 mg via INTRAMUSCULAR

## 2024-04-17 MED ORDER — KETOROLAC TROMETHAMINE 30 MG/ML IJ SOLN
INTRAMUSCULAR | Status: AC
  Filled 2024-04-17: qty 1

## 2024-04-17 MED ORDER — KETOROLAC TROMETHAMINE 30 MG/ML IJ SOLN
30.0000 mg | Freq: Once | INTRAMUSCULAR | Status: AC
  Administered 2024-04-17: 30 mg via INTRAMUSCULAR

## 2024-04-17 MED ORDER — BACLOFEN 10 MG PO TABS: 10.0000 mg | ORAL_TABLET | Freq: Three times a day (TID) | ORAL | 0 refills | Status: AC

## 2024-04-17 NOTE — Discharge Instructions (Addendum)
 You were given medications today to help with your headache: Toradol  which is an anti-inflammatory and Decadron  which is a steroid to help with inflammation. If your headache persists, worsens, you develop worsening vision changes, severe dizziness, passing out, weakness, numbness, chest pain, confusion, and slurred speech please seek immediate medical treatment in the emergency department.  I have prescribed baclofen  that you can take every 8 hours as needed for muscle pain and spasms.  This can make you drowsy so do not drive, work, or drink alcohol while taking this.   Otherwise you can continue alternating between 400 to 600 mg of ibuprofen  and 500 to 1000 mg of Tylenol  every 6-8 hours as needed for pain.  Avoid taking any ibuprofen  for at least 8 hours after receiving the Toradol  here in clinic today. You can follow-up with [place] [facility] if your pain continues for further evaluation. I have also attached information for a family medicine doctor that you can follow-up with to get established with a primary care doctor as well. Return here as needed.
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
## 2024-05-05 ENCOUNTER — Ambulatory Visit: Payer: Self-pay | Admitting: Family Medicine

## 2024-05-08 ENCOUNTER — Ambulatory Visit: Payer: Self-pay | Admitting: Family Medicine

## 2024-05-15 ENCOUNTER — Ambulatory Visit: Payer: Self-pay | Admitting: Medical

## 2024-06-15 ENCOUNTER — Emergency Department (HOSPITAL_BASED_OUTPATIENT_CLINIC_OR_DEPARTMENT_OTHER)
Admission: EM | Admit: 2024-06-15 | Discharge: 2024-06-15 | Disposition: A | Discharge status: Home / Self Care | Payer: Self-pay | Attending: Emergency Medicine | Admitting: Emergency Medicine

## 2024-06-15 ENCOUNTER — Other Ambulatory Visit: Payer: Self-pay

## 2024-06-15 ENCOUNTER — Encounter (HOSPITAL_BASED_OUTPATIENT_CLINIC_OR_DEPARTMENT_OTHER): Payer: Self-pay

## 2024-06-15 ENCOUNTER — Emergency Department (HOSPITAL_BASED_OUTPATIENT_CLINIC_OR_DEPARTMENT_OTHER): Payer: Self-pay | Admitting: Radiology

## 2024-06-15 DIAGNOSIS — W1809XA Striking against other object with subsequent fall, initial encounter: Secondary | ICD-10-CM | POA: Insufficient documentation

## 2024-06-15 DIAGNOSIS — M25512 Pain in left shoulder: Secondary | ICD-10-CM

## 2024-06-15 DIAGNOSIS — S40012A Contusion of left shoulder, initial encounter: Secondary | ICD-10-CM | POA: Insufficient documentation

## 2024-06-15 MED ORDER — NAPROXEN 500 MG PO TABS: 500.0000 mg | ORAL_TABLET | Freq: Two times a day (BID) | ORAL | 0 refills | Status: AC

## 2024-06-15 NOTE — ED Triage Notes (Signed)
 Pt presents c/o mechanical fall last night, hitting L shoulder on door frame. Difficulty w/ ROM. Pt reports iced shoulder and used icy-hot at home w/ no relief.

## 2024-06-15 NOTE — ED Provider Notes (Signed)
 " [place] [facility] AT [facility] Provider Note   CSN: [id number] Arrival date & time: 06/15/24  9147     Patient presents with: Shoulder Injury   Steven Rhodes is a 24 y.o. male patient with past medical history of ADHD, history of testicular torsion presents emergency room with complaint of left shoulder pain.  Patient reports that he tripped over his keys last night and fell into a door frame hitting his left shoulder.  He reports he can move his arm but it makes pain worse.  Has tried ice and IcyHot.  Denies any numbness or tingling.  No fever.    Shoulder Injury       Prior to Admission medications  Medication Sig Start Date End Date Taking? Authorizing Provider  naproxen  (NAPROSYN ) 500 MG tablet Take 1 tablet (500 mg total) by mouth 2 (two) times daily. 06/15/24  Yes Kamrie Fanton, Warren SAILOR, PA-C  baclofen  (LIORESAL ) 10 MG tablet Take 1 tablet (10 mg total) by mouth 3 (three) times daily. 04/17/24   Johnie Rumaldo LABOR, NP    Allergies: Patient has no known allergies.    Review of Systems  Musculoskeletal:  Positive for arthralgias.    Updated Vital Signs BP 113/76   Pulse 63   Temp 98.3 F (36.8 C)   Resp 16   Wt 104.3 kg   SpO2 100%   BMI 33.00 kg/m   Physical Exam Vitals and nursing note reviewed.  Constitutional:      General: He is not in acute distress.    Appearance: He is not toxic-appearing.  HENT:     Head: Normocephalic and atraumatic.  Eyes:     General: No scleral icterus.    Conjunctiva/sclera: Conjunctivae normal.  Cardiovascular:     Rate and Rhythm: Normal rate and regular rhythm.     Pulses: Normal pulses.     Heart sounds: Normal heart sounds.  Pulmonary:     Effort: Pulmonary effort is normal. No respiratory distress.     Breath sounds: Normal breath sounds.  Abdominal:     General: Abdomen is flat. Bowel sounds are normal.     Palpations: Abdomen is soft.     Tenderness: There is no abdominal  tenderness.  Musculoskeletal:     Comments: Reproducible tenderness over left anterior shoulder joint and distal clavicle. Small area of abrasion and bruising. Neurovascularly intact distally.   Skin:    General: Skin is warm and dry.     Findings: No lesion.  Neurological:     General: No focal deficit present.     Mental Status: He is alert and oriented to person, place, and time. Mental status is at baseline.     (all labs ordered are listed, but only abnormal results are displayed) Labs Reviewed - No data to display  EKG: None  Radiology: DG Shoulder Left Result Date: 06/15/2024 CLINICAL DATA:  Shoulder pain with limited range of motion after falling. EXAM: LEFT SHOULDER - 2+ VIEW COMPARISON:  None Available. FINDINGS: The mineralization and alignment are normal. There is no evidence of acute fracture or dislocation. The joint spaces appear preserved. No focal soft tissue abnormalities are seen. IMPRESSION: No evidence of acute fracture or dislocation. Electronically Signed   By: Elsie Perone M.D.   On: 06/15/2024 10:11     Procedures   Medications Ordered in the ED - No data to display  Medical Decision Making Amount and/or Complexity of Data Reviewed Radiology: ordered.   This patient presents to the ED for concern of shoulder pain, this involves an extensive number of treatment options, and is a complaint that carries with it a high risk of complications and morbidity.  The differential diagnosis includes septic joint, fracture, sprain, DVT   Imaging Studies ordered:  I ordered imaging studies including left shoulder x-ray I independently visualized and interpreted imaging which showed no acute findings.  I agree with the radiologist interpretation   Cardiac Monitoring: / EKG:  The patient was maintained on a cardiac monitor.     Problem List / ED Course / Critical interventions / Medication management  Patient presents to  emergency room with complaint of left shoulder pain after having mechanical fall last night.  On arrival hemodynamically stable and well-appearing.  During his fall he had no other injuries and did not hit his head.  Patient has reproducible tenderness to the anterior shoulder.  He has pain with active range of motion above 90 degrees.  He has strong radial pulse and sensation is intact.  His pain is well-controlled.  Will obtain x-ray to rule out fracture or other acute findings. I have reviewed the patients home medicines and have made adjustments as needed. Hemodynamically stable and appropriate for discharge with outpatient follow-up.  Given return precautions.        Final diagnoses:  Acute pain of left shoulder    ED Discharge Orders          Ordered    naproxen  (NAPROSYN ) 500 MG tablet  2 times daily        06/15/24 1035               Worthington Cruzan, Warren SAILOR, PA-C 06/15/24 1036    Jerrol Agent, MD 06/15/24 1119  "

## 2024-06-15 NOTE — Discharge Instructions (Signed)
 Wear sling as needed.  Take 1000 mg of Tylenol  every 8 hours.  Take 500 mg of Naproxen  every 12 hours with food.  Try ice or heat over area of pain.  Follow-up with orthopedics you will have to call to schedule appointment.  Return to ED with new or worsening symptoms.

## 2024-07-03 ENCOUNTER — Ambulatory Visit (HOSPITAL_BASED_OUTPATIENT_CLINIC_OR_DEPARTMENT_OTHER): Payer: Self-pay | Admitting: Student
